# Patient Record
Sex: Female | Born: 1950 | Race: Black or African American | Hispanic: No | Marital: Single | Smoking: Former smoker
Health system: Southern US, Community
[De-identification: ages and names within clinical notes are randomized; demographics above are authoritative.]

## PROBLEM LIST (undated history)

## (undated) DIAGNOSIS — M51369 Other intervertebral disc degeneration, lumbar region without mention of lumbar back pain or lower extremity pain: Secondary | ICD-10-CM

## (undated) DIAGNOSIS — K219 Gastro-esophageal reflux disease without esophagitis: Secondary | ICD-10-CM

## (undated) DIAGNOSIS — I10 Essential (primary) hypertension: Secondary | ICD-10-CM

## (undated) DIAGNOSIS — G4730 Sleep apnea, unspecified: Secondary | ICD-10-CM

## (undated) DIAGNOSIS — G629 Polyneuropathy, unspecified: Secondary | ICD-10-CM

## (undated) DIAGNOSIS — M5136 Other intervertebral disc degeneration, lumbar region: Secondary | ICD-10-CM

## (undated) DIAGNOSIS — E0590 Thyrotoxicosis, unspecified without thyrotoxic crisis or storm: Secondary | ICD-10-CM

## (undated) DIAGNOSIS — J189 Pneumonia, unspecified organism: Secondary | ICD-10-CM

## (undated) DIAGNOSIS — R519 Headache, unspecified: Secondary | ICD-10-CM

## (undated) DIAGNOSIS — E119 Type 2 diabetes mellitus without complications: Secondary | ICD-10-CM

## (undated) DIAGNOSIS — R011 Cardiac murmur, unspecified: Secondary | ICD-10-CM

## (undated) DIAGNOSIS — R51 Headache: Secondary | ICD-10-CM

## (undated) HISTORY — PX: UPPER GI ENDOSCOPY: SHX6162

## (undated) HISTORY — PX: COLONOSCOPY W/ POLYPECTOMY: SHX1380

---

## 2012-07-17 DIAGNOSIS — M5417 Radiculopathy, lumbosacral region: Secondary | ICD-10-CM | POA: Insufficient documentation

## 2012-07-17 DIAGNOSIS — G894 Chronic pain syndrome: Secondary | ICD-10-CM | POA: Insufficient documentation

## 2012-07-17 DIAGNOSIS — Z79891 Long term (current) use of opiate analgesic: Secondary | ICD-10-CM | POA: Insufficient documentation

## 2015-06-24 DIAGNOSIS — I700 Atherosclerosis of aorta: Secondary | ICD-10-CM | POA: Insufficient documentation

## 2015-06-27 DIAGNOSIS — J189 Pneumonia, unspecified organism: Secondary | ICD-10-CM

## 2015-06-27 HISTORY — DX: Pneumonia, unspecified organism: J18.9

## 2015-10-19 DIAGNOSIS — E088 Diabetes mellitus due to underlying condition with unspecified complications: Secondary | ICD-10-CM | POA: Insufficient documentation

## 2015-10-19 DIAGNOSIS — I10 Essential (primary) hypertension: Secondary | ICD-10-CM | POA: Insufficient documentation

## 2016-05-16 DIAGNOSIS — N2889 Other specified disorders of kidney and ureter: Secondary | ICD-10-CM | POA: Insufficient documentation

## 2016-05-23 DIAGNOSIS — I351 Nonrheumatic aortic (valve) insufficiency: Secondary | ICD-10-CM | POA: Insufficient documentation

## 2016-05-26 HISTORY — PX: PARTIAL NEPHRECTOMY: SHX414

## 2016-06-13 DIAGNOSIS — R630 Anorexia: Secondary | ICD-10-CM

## 2016-06-13 DIAGNOSIS — N179 Acute kidney failure, unspecified: Secondary | ICD-10-CM

## 2016-06-13 DIAGNOSIS — I959 Hypotension, unspecified: Secondary | ICD-10-CM

## 2016-06-13 DIAGNOSIS — E860 Dehydration: Secondary | ICD-10-CM

## 2016-06-13 DIAGNOSIS — R110 Nausea: Secondary | ICD-10-CM

## 2016-06-13 DIAGNOSIS — I2510 Atherosclerotic heart disease of native coronary artery without angina pectoris: Secondary | ICD-10-CM

## 2016-06-13 DIAGNOSIS — N390 Urinary tract infection, site not specified: Secondary | ICD-10-CM

## 2016-06-13 DIAGNOSIS — E1169 Type 2 diabetes mellitus with other specified complication: Secondary | ICD-10-CM

## 2016-06-13 DIAGNOSIS — K219 Gastro-esophageal reflux disease without esophagitis: Secondary | ICD-10-CM

## 2016-06-13 DIAGNOSIS — I10 Essential (primary) hypertension: Secondary | ICD-10-CM

## 2016-06-13 DIAGNOSIS — E785 Hyperlipidemia, unspecified: Secondary | ICD-10-CM

## 2016-06-14 DIAGNOSIS — E860 Dehydration: Secondary | ICD-10-CM | POA: Diagnosis not present

## 2016-06-14 DIAGNOSIS — N179 Acute kidney failure, unspecified: Secondary | ICD-10-CM | POA: Diagnosis not present

## 2016-06-14 DIAGNOSIS — I959 Hypotension, unspecified: Secondary | ICD-10-CM | POA: Diagnosis not present

## 2016-06-14 DIAGNOSIS — R110 Nausea: Secondary | ICD-10-CM | POA: Diagnosis not present

## 2016-06-15 DIAGNOSIS — I959 Hypotension, unspecified: Secondary | ICD-10-CM | POA: Diagnosis not present

## 2016-06-15 DIAGNOSIS — N179 Acute kidney failure, unspecified: Secondary | ICD-10-CM | POA: Diagnosis not present

## 2016-06-15 DIAGNOSIS — E860 Dehydration: Secondary | ICD-10-CM | POA: Diagnosis not present

## 2016-06-15 DIAGNOSIS — R110 Nausea: Secondary | ICD-10-CM | POA: Diagnosis not present

## 2016-08-29 ENCOUNTER — Other Ambulatory Visit: Payer: Self-pay | Admitting: Neurosurgery

## 2016-09-22 NOTE — Pre-Procedure Instructions (Addendum)
    Laura Fox  09/22/2016      Report to [facility] Admitting  Wednesday, April 11 at 900 AM.               Your surgery or procedure is scheduled for 11:00 AM   Call this number if you have problems the morning of surgery:[phone]               For any other questions, please call [phone], Monday - Friday 8 AM - 4 PM.  Remember:  Do not eat food or drink liquids after midnight.  Take these medicines the morning of surgery with A SIP OF WATER gabapentin (neurontin), omeprazole (prilosec), oxycodone-acetaminophen (tylenol).  Stop taking any Vitamins or Herbal Medications. No Aspirin Goody's, BC's, Aleve, Advil, Motrin, Ibuprofen, naproxen or Fish oil.  Stop 1 week prior to surgery.   Do not wear jewelry, make-up or nail polish.  Do not wear lotions, powders, or perfumes, or deoderant.  Do not shave 48 hours prior to surgery.    Do not bring valuables to the hospital.  [facility] is not responsible for any belongings or valuables.  Contacts, dentures or bridgework may not be worn into surgery.  Leave your suitcase in the car.  After surgery it may be brought to your room.  For patients admitted to the hospital, discharge time will be determined by your treatment team.  Patients discharged the day of surgery will not be allowed to drive home.   Special instructions: Review  [place] - Preparing For Surgery.  Please read over the following fact sheets that you were given. Pain Booklet, Coughing and Deep Breathing, MRSA Information and Surgical Site Infection Prevention

## 2016-09-25 ENCOUNTER — Encounter (HOSPITAL_COMMUNITY)
Admission: RE | Admit: 2016-09-25 | Discharge: 2016-09-25 | Disposition: A | Discharge status: Home / Self Care | Payer: Medicare HMO | Source: Ambulatory Visit | Attending: Neurosurgery | Admitting: Neurosurgery

## 2016-09-25 ENCOUNTER — Encounter (HOSPITAL_COMMUNITY): Payer: Self-pay

## 2016-09-25 DIAGNOSIS — K219 Gastro-esophageal reflux disease without esophagitis: Secondary | ICD-10-CM | POA: Insufficient documentation

## 2016-09-25 DIAGNOSIS — E039 Hypothyroidism, unspecified: Secondary | ICD-10-CM | POA: Diagnosis not present

## 2016-09-25 DIAGNOSIS — I10 Essential (primary) hypertension: Secondary | ICD-10-CM | POA: Diagnosis not present

## 2016-09-25 DIAGNOSIS — I350 Nonrheumatic aortic (valve) stenosis: Secondary | ICD-10-CM | POA: Diagnosis not present

## 2016-09-25 DIAGNOSIS — E119 Type 2 diabetes mellitus without complications: Secondary | ICD-10-CM | POA: Insufficient documentation

## 2016-09-25 DIAGNOSIS — Z87891 Personal history of nicotine dependence: Secondary | ICD-10-CM | POA: Diagnosis not present

## 2016-09-25 DIAGNOSIS — Z7982 Long term (current) use of aspirin: Secondary | ICD-10-CM | POA: Diagnosis not present

## 2016-09-25 DIAGNOSIS — Z01818 Encounter for other preprocedural examination: Secondary | ICD-10-CM | POA: Insufficient documentation

## 2016-09-25 HISTORY — DX: Polyneuropathy, unspecified: G62.9

## 2016-09-25 HISTORY — DX: Gastro-esophageal reflux disease without esophagitis: K21.9

## 2016-09-25 HISTORY — DX: Headache, unspecified: R51.9

## 2016-09-25 HISTORY — DX: Pneumonia, unspecified organism: J18.9

## 2016-09-25 HISTORY — DX: Cardiac murmur, unspecified: R01.1

## 2016-09-25 HISTORY — DX: Essential (primary) hypertension: I10

## 2016-09-25 HISTORY — DX: Type 2 diabetes mellitus without complications: E11.9

## 2016-09-25 HISTORY — DX: Thyrotoxicosis, unspecified without thyrotoxic crisis or storm: E05.90

## 2016-09-25 HISTORY — DX: Other intervertebral disc degeneration, lumbar region: M51.36

## 2016-09-25 HISTORY — DX: Other intervertebral disc degeneration, lumbar region without mention of lumbar back pain or lower extremity pain: M51.369

## 2016-09-25 HISTORY — DX: Headache: R51

## 2016-09-25 LAB — CBC
HEMATOCRIT: 38 % (ref 36.0–46.0)
HEMOGLOBIN: 12.6 g/dL (ref 12.0–15.0)
MCH: 30.7 pg (ref 26.0–34.0)
MCHC: 33.2 g/dL (ref 30.0–36.0)
MCV: 92.5 fL (ref 78.0–100.0)
Platelets: 225 10*3/uL (ref 150–400)
RBC: 4.11 MIL/uL (ref 3.87–5.11)
RDW: 12.6 % (ref 11.5–15.5)
WBC: 8 10*3/uL (ref 4.0–10.5)

## 2016-09-25 LAB — BASIC METABOLIC PANEL
ANION GAP: 7 (ref 5–15)
BUN: 13 mg/dL (ref 6–20)
CO2: 27 mmol/L (ref 22–32)
Calcium: 9.1 mg/dL (ref 8.9–10.3)
Chloride: 106 mmol/L (ref 101–111)
Creatinine, Ser: 1.11 mg/dL — ABNORMAL HIGH (ref 0.44–1.00)
GFR calc Af Amer: 58 mL/min — ABNORMAL LOW (ref >60)
GFR, EST NON AFRICAN AMERICAN: 50 mL/min — AB (ref >60)
GLUCOSE: 100 mg/dL — AB (ref 65–99)
POTASSIUM: 3.9 mmol/L (ref 3.5–5.1)
Sodium: 140 mmol/L (ref 135–145)

## 2016-09-25 LAB — GLUCOSE, CAPILLARY: GLUCOSE-CAPILLARY: 97 mg/dL (ref 65–99)

## 2016-09-25 LAB — SURGICAL PCR SCREEN
MRSA, PCR: NEGATIVE
STAPHYLOCOCCUS AUREUS: NEGATIVE

## 2016-09-25 NOTE — Progress Notes (Signed)
Laura Fox denies chest pain or shortness of breath.  Patient was seen by cardiologist in Nov 2017.  Patient reports that that she was not  have chest pain then.

## 2016-09-26 LAB — HEMOGLOBIN A1C
Hgb A1c MFr Bld: 5.8 % — ABNORMAL HIGH (ref 4.8–5.6)
Mean Plasma Glucose: 120 mg/dL

## 2016-09-27 NOTE — Progress Notes (Addendum)
Anesthesia Chart Review:  Pt is a 66 year old female scheduled for left L4-5 microdiscectomy on 10/04/2016 with Tressie Stalker, M.D.  - Cardiologist is Belva Crome, MD, last office visit 05/23/16 (notes in care everywhere)  PMH includes: HTN, DM, hyperthyroidism, aortic stenosis (mild by 10/2015 echo), GERD. Former smoker. BMI 27.  Medications include: ASA 81 mg, Lipitor, lisinopril, Prilosec  Preoperative labs reviewed.  HbA1c 5.8, glucose 100  EKG 05/16/16: Sinus rhythm. Inferior infarct, age undetermined. Cannot rule out anterior infarct, age undetermined. Appears stable when compared to EKG 09/30/15  Nuclear stress test 11/11/15 [facility] cardiology [place]): 1. No ischemia seen on scan. 2. Normal gated images. 3. Normal EF 71%.  Echo 11/11/15 [facility] cardiology [place]): 1. Normal LV size and systolic function, EF 67%. 2. Calcific aortic stenosis, mild with moderate aortic regurgitation. 3. Mitral annular calcification with mild mitral regurgitation, mild LA dilatation. 4. Normal right heart size/function, only trace TR  If no changes, I anticipate pt can proceed with surgery as scheduled.   Rica Mast, FNP-BC [facility] Stay [facility]/Anesthesiology Phone: [phone] 09/27/2016 4:47 PM

## 2016-10-03 MED ORDER — CEFAZOLIN SODIUM-DEXTROSE 2-4 GM/100ML-% IV SOLN
2.0000 g | INTRAVENOUS | Status: AC
  Administered 2016-10-04: 2 g via INTRAVENOUS
  Filled 2016-10-03: qty 100

## 2016-10-04 ENCOUNTER — Ambulatory Visit (HOSPITAL_COMMUNITY): Payer: Medicare HMO | Admitting: Anesthesiology

## 2016-10-04 ENCOUNTER — Encounter (HOSPITAL_COMMUNITY)
Admission: RE | Disposition: A | Discharge status: Home / Self Care | Payer: Self-pay | Source: Ambulatory Visit | Attending: Neurosurgery

## 2016-10-04 ENCOUNTER — Observation Stay (HOSPITAL_COMMUNITY)
Admission: RE | Admit: 2016-10-04 | Discharge: 2016-10-05 | Disposition: A | Discharge status: Home / Self Care | Payer: Medicare HMO | Source: Ambulatory Visit | Attending: Neurosurgery | Admitting: Neurosurgery

## 2016-10-04 ENCOUNTER — Ambulatory Visit (HOSPITAL_COMMUNITY): Payer: Medicare HMO

## 2016-10-04 ENCOUNTER — Encounter (HOSPITAL_COMMUNITY): Payer: Self-pay | Admitting: *Deleted

## 2016-10-04 ENCOUNTER — Ambulatory Visit (HOSPITAL_COMMUNITY): Payer: Medicare HMO | Admitting: Emergency Medicine

## 2016-10-04 DIAGNOSIS — M5116 Intervertebral disc disorders with radiculopathy, lumbar region: Principal | ICD-10-CM | POA: Insufficient documentation

## 2016-10-04 DIAGNOSIS — M5126 Other intervertebral disc displacement, lumbar region: Secondary | ICD-10-CM | POA: Diagnosis present

## 2016-10-04 DIAGNOSIS — G8929 Other chronic pain: Secondary | ICD-10-CM | POA: Diagnosis present

## 2016-10-04 DIAGNOSIS — Z905 Acquired absence of kidney: Secondary | ICD-10-CM | POA: Diagnosis not present

## 2016-10-04 DIAGNOSIS — E1140 Type 2 diabetes mellitus with diabetic neuropathy, unspecified: Secondary | ICD-10-CM | POA: Diagnosis not present

## 2016-10-04 DIAGNOSIS — Z87891 Personal history of nicotine dependence: Secondary | ICD-10-CM | POA: Insufficient documentation

## 2016-10-04 DIAGNOSIS — Z79899 Other long term (current) drug therapy: Secondary | ICD-10-CM | POA: Insufficient documentation

## 2016-10-04 DIAGNOSIS — Z419 Encounter for procedure for purposes other than remedying health state, unspecified: Secondary | ICD-10-CM

## 2016-10-04 DIAGNOSIS — I10 Essential (primary) hypertension: Secondary | ICD-10-CM | POA: Diagnosis not present

## 2016-10-04 DIAGNOSIS — K219 Gastro-esophageal reflux disease without esophagitis: Secondary | ICD-10-CM | POA: Insufficient documentation

## 2016-10-04 DIAGNOSIS — Z7982 Long term (current) use of aspirin: Secondary | ICD-10-CM | POA: Diagnosis not present

## 2016-10-04 HISTORY — PX: LUMBAR LAMINECTOMY/DECOMPRESSION MICRODISCECTOMY: SHX5026

## 2016-10-04 LAB — GLUCOSE, CAPILLARY
GLUCOSE-CAPILLARY: 132 mg/dL — AB (ref 65–99)
GLUCOSE-CAPILLARY: 214 mg/dL — AB (ref 65–99)
GLUCOSE-CAPILLARY: 122 mg/dL — AB (ref 65–99)

## 2016-10-04 SURGERY — LUMBAR LAMINECTOMY/DECOMPRESSION MICRODISCECTOMY 1 LEVEL
Anesthesia: General | Laterality: Left

## 2016-10-04 MED ORDER — HYDRALAZINE HCL 20 MG/ML IJ SOLN
INTRAMUSCULAR | Status: AC
  Filled 2016-10-04: qty 1

## 2016-10-04 MED ORDER — BACITRACIN 50000 UNITS IM SOLR
INTRAMUSCULAR | Status: DC | PRN
  Administered 2016-10-04: 11:00:00

## 2016-10-04 MED ORDER — THROMBIN 5000 UNITS EX SOLR
CUTANEOUS | Status: AC
  Filled 2016-10-04: qty 10000

## 2016-10-04 MED ORDER — LIDOCAINE HCL (CARDIAC) 20 MG/ML IV SOLN
INTRAVENOUS | Status: DC | PRN
  Administered 2016-10-04: 80 mg via INTRAVENOUS

## 2016-10-04 MED ORDER — BISACODYL 10 MG RE SUPP
10.0000 mg | Freq: Every day | RECTAL | Status: DC | PRN
Start: 2016-10-04 — End: 2016-10-05

## 2016-10-04 MED ORDER — POLYETHYLENE GLYCOL 3350 17 G PO PACK: 17.0000 g | PACK | Freq: Every day | ORAL | Status: DC

## 2016-10-04 MED ORDER — ACETAMINOPHEN 325 MG PO TABS: 650.0000 mg | ORAL_TABLET | ORAL | Status: DC | PRN

## 2016-10-04 MED ORDER — OXYCODONE HCL 5 MG PO TABS
5.0000 mg | ORAL_TABLET | ORAL | Status: DC | PRN
  Administered 2016-10-04 – 2016-10-05 (×4): 10 mg via ORAL
  Filled 2016-10-04 (×4): qty 2

## 2016-10-04 MED ORDER — ONDANSETRON HCL 4 MG PO TABS: 4.0000 mg | ORAL_TABLET | Freq: Four times a day (QID) | ORAL | Status: DC | PRN

## 2016-10-04 MED ORDER — OXYCODONE HCL 5 MG/5ML PO SOLN: 5.0000 mg | Freq: Once | ORAL | Status: AC | PRN

## 2016-10-04 MED ORDER — PROPOFOL 10 MG/ML IV BOLUS
INTRAVENOUS | Status: DC | PRN
  Administered 2016-10-04: 160 mg via INTRAVENOUS

## 2016-10-04 MED ORDER — LIDOCAINE 2% (20 MG/ML) 5 ML SYRINGE
INTRAMUSCULAR | Status: AC
  Filled 2016-10-04: qty 10

## 2016-10-04 MED ORDER — THROMBIN 5000 UNITS EX SOLR
CUTANEOUS | Status: DC | PRN
  Administered 2016-10-04: 10000 [IU] via TOPICAL

## 2016-10-04 MED ORDER — NEOSTIGMINE METHYLSULFATE 10 MG/10ML IV SOLN
INTRAVENOUS | Status: DC | PRN
  Administered 2016-10-04: 3 mg via INTRAVENOUS

## 2016-10-04 MED ORDER — LISINOPRIL 20 MG PO TABS: 10.0000 mg | ORAL_TABLET | Freq: Every day | ORAL | Status: DC

## 2016-10-04 MED ORDER — OXYCODONE HCL 5 MG PO TABS
5.0000 mg | ORAL_TABLET | Freq: Once | ORAL | Status: AC | PRN
  Administered 2016-10-04: 5 mg via ORAL

## 2016-10-04 MED ORDER — BACITRACIN ZINC 500 UNIT/GM EX OINT
TOPICAL_OINTMENT | CUTANEOUS | Status: DC | PRN
  Administered 2016-10-04: 1 via TOPICAL

## 2016-10-04 MED ORDER — PANTOPRAZOLE SODIUM 40 MG PO TBEC
80.0000 mg | DELAYED_RELEASE_TABLET | Freq: Every day | ORAL | Status: DC
  Administered 2016-10-04 – 2016-10-05 (×2): 80 mg via ORAL
  Filled 2016-10-04 (×2): qty 2

## 2016-10-04 MED ORDER — ONDANSETRON HCL 4 MG/2ML IJ SOLN
INTRAMUSCULAR | Status: DC | PRN
  Administered 2016-10-04: 4 mg via INTRAVENOUS

## 2016-10-04 MED ORDER — DEXAMETHASONE SODIUM PHOSPHATE 10 MG/ML IJ SOLN
INTRAMUSCULAR | Status: AC
  Filled 2016-10-04: qty 2

## 2016-10-04 MED ORDER — CEFAZOLIN SODIUM-DEXTROSE 2-4 GM/100ML-% IV SOLN
2.0000 g | Freq: Three times a day (TID) | INTRAVENOUS | Status: AC
  Administered 2016-10-04 (×2): 2 g via INTRAVENOUS
  Filled 2016-10-04 (×2): qty 100

## 2016-10-04 MED ORDER — BACITRACIN ZINC 500 UNIT/GM EX OINT
TOPICAL_OINTMENT | CUTANEOUS | Status: AC
  Filled 2016-10-04: qty 28.35

## 2016-10-04 MED ORDER — FENTANYL CITRATE (PF) 100 MCG/2ML IJ SOLN
INTRAMUSCULAR | Status: DC | PRN
  Administered 2016-10-04: 100 ug via INTRAVENOUS
  Administered 2016-10-04: 50 ug via INTRAVENOUS

## 2016-10-04 MED ORDER — ACETAMINOPHEN 650 MG RE SUPP: 650.0000 mg | RECTAL | Status: DC | PRN

## 2016-10-04 MED ORDER — DOCUSATE SODIUM 100 MG PO CAPS
100.0000 mg | ORAL_CAPSULE | Freq: Two times a day (BID) | ORAL | Status: DC
  Administered 2016-10-04 – 2016-10-05 (×2): 100 mg via ORAL
  Filled 2016-10-04 (×2): qty 1

## 2016-10-04 MED ORDER — CHLORHEXIDINE GLUCONATE CLOTH 2 % EX PADS: 6.0000 | MEDICATED_PAD | Freq: Once | CUTANEOUS | Status: DC

## 2016-10-04 MED ORDER — GLYCOPYRROLATE 0.2 MG/ML IJ SOLN
INTRAMUSCULAR | Status: DC | PRN
  Administered 2016-10-04: 0.4 mg via INTRAVENOUS

## 2016-10-04 MED ORDER — LACTATED RINGERS IV SOLN
INTRAVENOUS | Status: DC
  Administered 2016-10-04 (×3): via INTRAVENOUS

## 2016-10-04 MED ORDER — MEPERIDINE HCL 25 MG/ML IJ SOLN: 6.2500 mg | INTRAMUSCULAR | Status: DC | PRN

## 2016-10-04 MED ORDER — SUGAMMADEX SODIUM 200 MG/2ML IV SOLN
INTRAVENOUS | Status: AC
  Filled 2016-10-04: qty 2

## 2016-10-04 MED ORDER — EPHEDRINE 5 MG/ML INJ
INTRAVENOUS | Status: AC
  Filled 2016-10-04: qty 10

## 2016-10-04 MED ORDER — SODIUM CHLORIDE 0.9% FLUSH
3.0000 mL | Freq: Two times a day (BID) | INTRAVENOUS | Status: DC
  Administered 2016-10-04 (×2): 3 mL via INTRAVENOUS

## 2016-10-04 MED ORDER — ROCURONIUM BROMIDE 50 MG/5ML IV SOSY
PREFILLED_SYRINGE | INTRAVENOUS | Status: AC
  Filled 2016-10-04: qty 5

## 2016-10-04 MED ORDER — PROMETHAZINE HCL 25 MG/ML IJ SOLN: 6.2500 mg | INTRAMUSCULAR | Status: DC | PRN

## 2016-10-04 MED ORDER — MORPHINE SULFATE (PF) 4 MG/ML IV SOLN: 4.0000 mg | INTRAVENOUS | Status: DC | PRN

## 2016-10-04 MED ORDER — ZOLPIDEM TARTRATE 5 MG PO TABS: 5.0000 mg | ORAL_TABLET | Freq: Every evening | ORAL | Status: DC | PRN

## 2016-10-04 MED ORDER — CYCLOBENZAPRINE HCL 10 MG PO TABS
10.0000 mg | ORAL_TABLET | Freq: Three times a day (TID) | ORAL | Status: DC | PRN
  Administered 2016-10-04 – 2016-10-05 (×3): 10 mg via ORAL
  Filled 2016-10-04 (×2): qty 1

## 2016-10-04 MED ORDER — NEOSTIGMINE METHYLSULFATE 5 MG/5ML IV SOSY
PREFILLED_SYRINGE | INTRAVENOUS | Status: AC
  Filled 2016-10-04: qty 5

## 2016-10-04 MED ORDER — EPHEDRINE SULFATE 50 MG/ML IJ SOLN
INTRAMUSCULAR | Status: DC | PRN
  Administered 2016-10-04 (×4): 10 mg via INTRAVENOUS

## 2016-10-04 MED ORDER — MIDAZOLAM HCL 5 MG/5ML IJ SOLN
INTRAMUSCULAR | Status: DC | PRN
  Administered 2016-10-04: 2 mg via INTRAVENOUS

## 2016-10-04 MED ORDER — HYDROMORPHONE HCL 1 MG/ML IJ SOLN: 0.2500 mg | INTRAMUSCULAR | Status: DC | PRN

## 2016-10-04 MED ORDER — SODIUM CHLORIDE 0.9% FLUSH: 3.0000 mL | INTRAVENOUS | Status: DC | PRN

## 2016-10-04 MED ORDER — PHENOL 1.4 % MT LIQD: 1.0000 | OROMUCOSAL | Status: DC | PRN

## 2016-10-04 MED ORDER — MENTHOL 3 MG MT LOZG: 1.0000 | LOZENGE | OROMUCOSAL | Status: DC | PRN

## 2016-10-04 MED ORDER — LIDOCAINE-EPINEPHRINE (PF) 2 %-1:200000 IJ SOLN
INTRAMUSCULAR | Status: AC
  Filled 2016-10-04: qty 20

## 2016-10-04 MED ORDER — OXYCODONE HCL 5 MG PO TABS
ORAL_TABLET | ORAL | Status: AC
  Administered 2016-10-04: 5 mg via ORAL
  Filled 2016-10-04: qty 1

## 2016-10-04 MED ORDER — TRAZODONE HCL 150 MG PO TABS
150.0000 mg | ORAL_TABLET | Freq: Every day | ORAL | Status: DC
  Administered 2016-10-04: 150 mg via ORAL
  Filled 2016-10-04 (×2): qty 1

## 2016-10-04 MED ORDER — SODIUM CHLORIDE 0.9 % IV SOLN: 250.0000 mL | INTRAVENOUS | Status: DC

## 2016-10-04 MED ORDER — GABAPENTIN 600 MG PO TABS
600.0000 mg | ORAL_TABLET | Freq: Three times a day (TID) | ORAL | Status: DC
  Administered 2016-10-04 – 2016-10-05 (×3): 600 mg via ORAL
  Filled 2016-10-04 (×3): qty 1

## 2016-10-04 MED ORDER — ONDANSETRON HCL 4 MG/2ML IJ SOLN: 4.0000 mg | Freq: Four times a day (QID) | INTRAMUSCULAR | Status: DC | PRN

## 2016-10-04 MED ORDER — ONDANSETRON HCL 4 MG/2ML IJ SOLN
INTRAMUSCULAR | Status: AC
  Filled 2016-10-04: qty 6

## 2016-10-04 MED ORDER — LIDOCAINE-EPINEPHRINE (PF) 2 %-1:200000 IJ SOLN
INTRAMUSCULAR | Status: DC | PRN
  Administered 2016-10-04 (×2): 10 mL via INTRADERMAL

## 2016-10-04 MED ORDER — CYCLOBENZAPRINE HCL 10 MG PO TABS
ORAL_TABLET | ORAL | Status: AC
  Administered 2016-10-04: 10 mg via ORAL
  Filled 2016-10-04: qty 1

## 2016-10-04 MED ORDER — ATORVASTATIN CALCIUM 20 MG PO TABS
20.0000 mg | ORAL_TABLET | Freq: Every day | ORAL | Status: DC
  Administered 2016-10-04: 20 mg via ORAL
  Filled 2016-10-04: qty 1

## 2016-10-04 MED ORDER — DEXAMETHASONE SODIUM PHOSPHATE 10 MG/ML IJ SOLN
INTRAMUSCULAR | Status: DC | PRN
  Administered 2016-10-04: 10 mg via INTRAVENOUS

## 2016-10-04 MED ORDER — ROCURONIUM BROMIDE 100 MG/10ML IV SOLN
INTRAVENOUS | Status: DC | PRN
  Administered 2016-10-04: 50 mg via INTRAVENOUS

## 2016-10-04 MED ORDER — 0.9 % SODIUM CHLORIDE (POUR BTL) OPTIME
TOPICAL | Status: DC | PRN
  Administered 2016-10-04: 1000 mL

## 2016-10-04 MED ORDER — POLYVINYL ALCOHOL 1.4 % OP SOLN
1.0000 [drp] | Freq: Every day | OPHTHALMIC | Status: DC
  Administered 2016-10-04: 1 [drp] via OPHTHALMIC
  Filled 2016-10-04: qty 15

## 2016-10-04 MED ORDER — HEMOSTATIC AGENTS (NO CHARGE) OPTIME
TOPICAL | Status: DC | PRN
  Administered 2016-10-04: 1 via TOPICAL

## 2016-10-04 MED ORDER — FENTANYL CITRATE (PF) 250 MCG/5ML IJ SOLN
INTRAMUSCULAR | Status: AC
  Filled 2016-10-04: qty 5

## 2016-10-04 SURGICAL SUPPLY — 48 items
BAG DECANTER FOR FLEXI CONT (MISCELLANEOUS) ×3 IMPLANT
BENZOIN TINCTURE PRP APPL 2/3 (GAUZE/BANDAGES/DRESSINGS) ×3 IMPLANT
BLADE CLIPPER SURG (BLADE) IMPLANT
BUR MATCHSTICK NEURO 3.0 LAGG (BURR) ×3 IMPLANT
BUR PRECISION FLUTE 6.0 (BURR) ×3 IMPLANT
CANISTER SUCT 3000ML PPV (MISCELLANEOUS) ×3 IMPLANT
CARTRIDGE OIL MAESTRO DRILL (MISCELLANEOUS) ×1 IMPLANT
CLOSURE WOUND 1/2 X4 (GAUZE/BANDAGES/DRESSINGS) ×1
DIFFUSER DRILL AIR PNEUMATIC (MISCELLANEOUS) ×3 IMPLANT
DRAPE LAPAROTOMY 100X72X124 (DRAPES) ×3 IMPLANT
DRAPE MICROSCOPE LEICA (MISCELLANEOUS) ×3 IMPLANT
DRAPE POUCH INSTRU U-SHP 10X18 (DRAPES) ×3 IMPLANT
DRAPE SURG 17X23 STRL (DRAPES) ×12 IMPLANT
ELECT BLADE 4.0 EZ CLEAN MEGAD (MISCELLANEOUS) ×3
ELECT REM PT RETURN 9FT ADLT (ELECTROSURGICAL) ×3
ELECTRODE BLDE 4.0 EZ CLN MEGD (MISCELLANEOUS) ×1 IMPLANT
ELECTRODE REM PT RTRN 9FT ADLT (ELECTROSURGICAL) ×1 IMPLANT
GAUZE SPONGE 4X4 12PLY STRL (GAUZE/BANDAGES/DRESSINGS) ×3 IMPLANT
GAUZE SPONGE 4X4 16PLY XRAY LF (GAUZE/BANDAGES/DRESSINGS) IMPLANT
GLOVE BIO SURGEON STRL SZ8 (GLOVE) ×3 IMPLANT
GLOVE BIO SURGEON STRL SZ8.5 (GLOVE) ×3 IMPLANT
GLOVE EXAM NITRILE LRG STRL (GLOVE) IMPLANT
GLOVE EXAM NITRILE XL STR (GLOVE) IMPLANT
GLOVE EXAM NITRILE XS STR PU (GLOVE) IMPLANT
GOWN STRL REUS W/ TWL LRG LVL3 (GOWN DISPOSABLE) IMPLANT
GOWN STRL REUS W/ TWL XL LVL3 (GOWN DISPOSABLE) ×1 IMPLANT
GOWN STRL REUS W/TWL 2XL LVL3 (GOWN DISPOSABLE) IMPLANT
GOWN STRL REUS W/TWL LRG LVL3 (GOWN DISPOSABLE)
GOWN STRL REUS W/TWL XL LVL3 (GOWN DISPOSABLE) ×2
KIT BASIN OR (CUSTOM PROCEDURE TRAY) ×3 IMPLANT
KIT ROOM TURNOVER OR (KITS) ×3 IMPLANT
NEEDLE HYPO 21X1.5 SAFETY (NEEDLE) IMPLANT
NEEDLE HYPO 22GX1.5 SAFETY (NEEDLE) ×3 IMPLANT
NS IRRIG 1000ML POUR BTL (IV SOLUTION) ×3 IMPLANT
OIL CARTRIDGE MAESTRO DRILL (MISCELLANEOUS) ×3
PACK LAMINECTOMY NEURO (CUSTOM PROCEDURE TRAY) ×3 IMPLANT
PAD ARMBOARD 7.5X6 YLW CONV (MISCELLANEOUS) ×9 IMPLANT
PATTIES SURGICAL .5 X1 (DISPOSABLE) ×3 IMPLANT
RUBBERBAND STERILE (MISCELLANEOUS) ×6 IMPLANT
SPONGE SURGIFOAM ABS GEL SZ50 (HEMOSTASIS) ×6 IMPLANT
STRIP CLOSURE SKIN 1/2X4 (GAUZE/BANDAGES/DRESSINGS) ×2 IMPLANT
SUT VIC AB 1 CT1 18XBRD ANBCTR (SUTURE) ×1 IMPLANT
SUT VIC AB 1 CT1 8-18 (SUTURE) ×2
SUT VIC AB 2-0 CP2 18 (SUTURE) ×3 IMPLANT
TAPE CLOTH SURG 4X10 WHT LF (GAUZE/BANDAGES/DRESSINGS) ×3 IMPLANT
TOWEL GREEN STERILE (TOWEL DISPOSABLE) ×3 IMPLANT
TOWEL GREEN STERILE FF (TOWEL DISPOSABLE) ×2 IMPLANT
WATER STERILE IRR 1000ML POUR (IV SOLUTION) ×3 IMPLANT

## 2016-10-04 NOTE — Transfer of Care (Signed)
Immediate Anesthesia Transfer of Care Note  Patient: Laura Fox  Procedure(s) Performed: Procedure(s) with comments: MICRODISCECTOMY LEFT LUMBAR FOUR- LUMBAR FIVE (Left) - MICRODISCECTOMY LEFT LUMBAR 4- LUMBAR 5  Patient Location: PACU  Anesthesia Type:General  Level of Consciousness: awake, alert , oriented and patient cooperative  Airway & Oxygen Therapy: Patient Spontanous Breathing and Patient connected to nasal cannula oxygen  Post-op Assessment: Report given to RN and Post -op Vital signs reviewed and stable  Post vital signs: Reviewed and stable  Last Vitals:  Vitals:   10/04/16 0818  BP: 120/75  Pulse: 65  Resp: 18  Temp: 36.6 C    Last Pain:  Vitals:   10/04/16 0825  TempSrc:   PainSc: 7          Complications: No apparent anesthesia complications

## 2016-10-04 NOTE — Anesthesia Preprocedure Evaluation (Addendum)
Anesthesia Evaluation  Patient identified by MRN, date of birth, ID band Patient awake    Reviewed: Allergy & Precautions, NPO status , Patient's Chart, lab work & pertinent test results  Airway Mallampati: II  TM Distance: >3 FB Neck ROM: Full    Dental no notable dental hx. (+) Edentulous Upper, Upper Dentures   Pulmonary neg pulmonary ROS, former smoker,    Pulmonary exam normal breath sounds clear to auscultation       Cardiovascular hypertension, negative cardio ROS Normal cardiovascular exam+ Valvular Problems/Murmurs AS  Rhythm:Regular Rate:Normal     Neuro/Psych negative neurological ROS  negative psych ROS   GI/Hepatic negative GI ROS, Neg liver ROS, GERD  ,  Endo/Other  negative endocrine ROSdiabetes  Renal/GU negative Renal ROS  negative genitourinary   Musculoskeletal negative musculoskeletal ROS (+) Arthritis ,   Abdominal   Peds negative pediatric ROS (+)  Hematology negative hematology ROS (+)   Anesthesia Other Findings   Reproductive/Obstetrics negative OB ROS                            Anesthesia Physical Anesthesia Plan  ASA: II  Anesthesia Plan: General   Post-op Pain Management:    Induction: Intravenous  Airway Management Planned: Oral ETT  Additional Equipment:   Intra-op Plan:   Post-operative Plan: Extubation in OR  Informed Consent: I have reviewed the patients History and Physical, chart, labs and discussed the procedure including the risks, benefits and alternatives for the proposed anesthesia with the patient or authorized representative who has indicated his/her understanding and acceptance.   Dental advisory given  Plan Discussed with: CRNA  Anesthesia Plan Comments:         Anesthesia Quick Evaluation

## 2016-10-04 NOTE — Progress Notes (Signed)
Subjective:  The patient is somnolent but easily arousable. She is in no apparent distress. She looks well.  Objective: Vital signs in last 24 hours: Temp:  [97.8 F (36.6 C)-97.9 F (36.6 C)] 97.9 F (36.6 C) (04/11 1110) Pulse Rate:  [62-77] 62 (04/11 1145) Resp:  [12-18] 12 (04/11 1145) BP: (105-120)/(37-75) 105/39 (04/11 1145) SpO2:  [100 %] 100 % (04/11 1145) Weight:  [78.9 kg (174 lb)-79 kg (174 lb 4 oz)] 78.9 kg (174 lb) (04/11 0825)  Intake/Output from previous day: No intake/output data recorded. Intake/Output this shift: Total I/O In: 1500 [I.V.:1500] Out: 75 [Blood:75]  Physical exam the patient is somnolent but arousable. She is moving her lower extremities well.  Lab Results: No results for input(s): WBC, HGB, HCT, PLT in the last 72 hours. BMET No results for input(s): NA, K, CL, CO2, GLUCOSE, BUN, CREATININE, CALCIUM in the last 72 hours.  Studies/Results: Dg Lumbar Spine 1 View  Result Date: 10/04/2016 CLINICAL DATA:  Microdiscectomy left L4-5. EXAM: LUMBAR SPINE - 1 VIEW COMPARISON:  MRI of the lumbar spine 03/22/2017 FINDINGS: A surgical probe and sponge are at the level of the L4 spinous process. AP alignment is stable. Atherosclerotic changes are noted. IMPRESSION: Surgical probe and sponge at the L4 spinous process. Electronically Signed   By: Marin Roberts M.D.   On: 10/04/2016 10:27    Assessment/Plan: The patient is doing well. I spoke with her family.  LOS: 0 days     Jasminemarie Sherrard D 10/04/2016, 11:53 AM

## 2016-10-04 NOTE — Op Note (Signed)
Brief history: The patient is a 66 year old black female who has complained of diffuse pain. Recently she developed left lower extremity radicular symptoms. She has failed medical management and was worked up with a lumbar MRI which demonstrated a herniated disc at L4-5 on the left. I discussed the various treatment options with her. She has decided to proceed with surgery after weighing the risks, benefits, and alternatives.  Preoperative diagnosis: Left L4-5 herniated disc, lumbar radiculopathy, lumbago  Postoperative diagnosis: The same  Procedure: Left L4-5 Intervertebral discectomy using micro-dissection  Surgeon: Dr. Delma Officer  Asst.: None  Anesthesia: Gen. endotracheal  Estimated blood loss: Minimal  Drains: None  Complications: None  Description of procedure: The patient was brought to the operating room by the anesthesia team. General endotracheal anesthesia was induced. The patient was turned to the prone position on the Wilson frame. The patient's lumbosacral region was then prepared with Betadine scrub and Betadine solution. Sterile drapes were applied.  I then injected the area to be incised with Marcaine with epinephrine solution. I then used a scalpel to make a linear midline incision over the L4-5 intervertebral disc space. I then used electrocautery to perform a left sided subperiosteal dissection exposing the spinous process and lamina of L4 and L5. We obtained intraoperative radiograph to confirm our location. I then inserted the [facility] retractor for exposure.  We then brought the operative microscope into the field. Under its magnification and illumination we completed the microdissection. I used a high-speed drill to perform a laminotomy at L4-5 on the left. I then used a Kerrison punches to widen the laminotomy and removed the ligamentum flavum at L4-5 on the left. We then used microdissection to free up the thecal sac and the L5 nerve root from the epidural  tissue. I then used a Kerrison punch to perform a foraminotomy at about the left L5 nerve root. We then using the nerve root retractor to gently retract the thecal sac and the left L5 nerve root medially. This exposed the intervertebral disc herniation. We identified the ruptured disc and remove it with the pituitary forceps. I performed a partial intervertebral vasectomy with the pituitary forceps.  I then palpated along the ventral surface of the thecal sac and along exit route of the left L5 nerve root and noted that the neural structures were well decompressed. This completed the decompression.  We then obtained hemostasis using bipolar electrocautery. We irrigated the wound out with bacitracin solution. We then removed the retractor. We then reapproximated the patient's thoracolumbar fascia with interrupted #1 Vicryl suture. We then reapproximated the patient's subcutaneous tissue with interrupted 2-0 Vicryl suture. We then reapproximated patient's skin with Steri-Strips and benzoin. The was then coated with bacitracin ointment. The drapes were removed. The patient was subsequently returned to the supine position where they were extubated by the anesthesia team. The patient was then transported to the postanesthesia care unit in stable condition. All sponge instrument and needle counts were reportedly correct at the end of this case.

## 2016-10-04 NOTE — Evaluation (Signed)
Physical Therapy Evaluation Patient Details Name: Laura Fox MRN: [id number] DOB: 1950-07-30 Today's Date: 10/04/2016   History of Present Illness  Pt is 66 y/o female s/p L4-5 diskectomy. PMH includes DM, DDD, murmur, HTN, and neuropathy.   Clinical Impression  Patient is s/p above surgery resulting in the deficits listed below (see PT Problem List). PTA, pt was independent with all functional mobility. Upon evaluation, pt with decreased strength and balance. Pt requiring min A and HHA for mobility. Will need to attempt gait training with RW next session. Recommending HHPT at d/c to increase independence and safety with functional mobility.  Patient will benefit from skilled PT to increase their independence and safety with mobility (while adhering to their precautions) to allow discharge to the venue listed below. Will continue to follow.      Follow Up Recommendations Home health PT;Supervision/Assistance - 24 hour    Equipment Recommendations  3in1 (PT)    Recommendations for Other Services       Precautions / Restrictions Precautions Precautions: Back Precaution Booklet Issued: Yes (comment) Precaution Comments: Reviewed back precaution handout with pt. Restrictions Weight Bearing Restrictions: No      Mobility  Bed Mobility Overal bed mobility: Needs Assistance Bed Mobility: Rolling;Supine to Sit Rolling: Min guard   Supine to sit: Min guard     General bed mobility comments: Min guard for safety. Verbal cues for use of log roll technique.   Transfers Overall transfer level: Needs assistance Equipment used: 1 person hand held assist Transfers: Sit to/from Stand Sit to Stand: Min guard         General transfer comment: Min guard for steadying   Ambulation/Gait Ambulation/Gait assistance: Min assist Ambulation Distance (Feet): 100 Feet Assistive device: 1 person hand held assist Gait Pattern/deviations: Step-through pattern;Decreased stride  length Gait velocity: Decreased Gait velocity interpretation: Below normal speed for age/gender General Gait Details: Slow, guarded gait. Min A and HHA for steadying. Educated about use of RW for mobility, will attempt next session.   Stairs            Wheelchair Mobility    Modified Rankin (Stroke Patients Only)       Balance Overall balance assessment: Needs assistance Sitting-balance support: No upper extremity supported;Feet supported Sitting balance-Leahy Scale: Good     Standing balance support: Single extremity supported;During functional activity Standing balance-Leahy Scale: Poor Standing balance comment: Reliant on HHA and RW for steadying                              Pertinent Vitals/Pain Pain Assessment: Faces Faces Pain Scale: Hurts even more Pain Location: back  Pain Descriptors / Indicators: Operative site guarding;Sore Pain Intervention(s): Limited activity within patient's tolerance;Monitored during session;Repositioned    Home Living Family/patient expects to be discharged to:: Private residence Living Arrangements: Other (Comment) (grandson ) Available Help at Discharge: Family;Available PRN/intermittently Type of Home: House Home Access: Stairs to enter Entrance Stairs-Rails: Right;Left;Can reach both Entrance Stairs-Number of Steps: 3 Home Layout: One level Home Equipment: Walker - 2 wheels      Prior Function Level of Independence: Independent               Hand Dominance        Extremity/Trunk Assessment   Upper Extremity Assessment Upper Extremity Assessment: Overall WFL for tasks assessed    Lower Extremity Assessment Lower Extremity Assessment: Generalized weakness (At least 3+/5 throughout )    Cervical /  Trunk Assessment Cervical / Trunk Assessment: Other exceptions Cervical / Trunk Exceptions: s/p surgery   Communication   Communication: No difficulties  Cognition Arousal/Alertness:  Awake/alert Behavior During Therapy: WFL for tasks assessed/performed Overall Cognitive Status: Within Functional Limits for tasks assessed                                        General Comments General comments (skin integrity, edema, etc.): Pt's grandson present throughout session. Educated about generalized walking program to perform at home. Educated about use of RW at home.     Exercises     Assessment/Plan    PT Assessment Patient needs continued PT services  PT Problem List Decreased strength;Decreased balance;Decreased mobility;Decreased knowledge of use of DME;Decreased knowledge of precautions;Pain       PT Treatment Interventions DME instruction;Gait training;Stair training;Functional mobility training;Therapeutic activities;Therapeutic exercise;Balance training;Neuromuscular re-education;Patient/family education    PT Goals (Current goals can be found in the Care Plan section)  Acute Rehab PT Goals Patient Stated Goal: to return home  PT Goal Formulation: With patient Time For Goal Achievement: 10/11/16 Potential to Achieve Goals: Good    Frequency Min 5X/week   Barriers to discharge Decreased caregiver support Grandson available PRN     Co-evaluation               End of Session Equipment Utilized During Treatment: Gait belt Activity Tolerance: Patient tolerated treatment well Patient left: in bed;with call bell/phone within reach;with family/visitor present Nurse Communication: Mobility status PT Visit Diagnosis: Other abnormalities of gait and mobility (R26.89);Pain Pain - part of body:  (back )    Time: 4098-1191 PT Time Calculation (min) (ACUTE ONLY): 20 min   Charges:   PT Evaluation $PT Eval Low Complexity: 1 Procedure PT Treatments $Gait Training: 8-22 mins   PT G Codes:   PT G-Codes **NOT FOR INPATIENT CLASS** Functional Assessment Tool Used: AM-PAC 6 Clicks Basic Mobility;Clinical judgement Functional Limitation:  Mobility: Walking and moving around Mobility: Walking and Moving Around Current Status ([id number]): At least 40 percent but less than 60 percent impaired, limited or restricted Mobility: Walking and Moving Around Goal Status [phone]): At least 1 percent but less than 20 percent impaired, limited or restricted    Margot Chimes, PT, DPT  Acute Rehabilitation Services  Pager: [phone]   Melvyn Novas 10/04/2016, 4:36 PM

## 2016-10-04 NOTE — H&P (Signed)
Subjective: The patient is a 66 year old black female who has had chronic pain. More recently she has developed back and left buttock and leg pain consistent with a lumbar radiculopathy. She has failed medical management and was worked up with a lumbar MRI. This demonstrated a left herniated disc at L4-5. I discussed the various treatment options with the patient. She has decided to proceed with surgery.   Past Medical History:  Diagnosis Date  . DDD (degenerative disc disease), lumbar   . 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 GERD (gastroesophageal reflux disease)   . Headache   . Heart murmur    Echo-  04/2016- mild aortic stenosis  . Hypertension   . Hyperthyroidism    goiter  . Neuropathy (HCC)    feet  . Pneumonia 2017    Past Surgical History:  Procedure Laterality Date  . COLONOSCOPY W/ POLYPECTOMY    . PARTIAL NEPHRECTOMY Left 05/26/2016   stent  . UPPER GI ENDOSCOPY      Allergies  Allergen Reactions  . No Known Allergies     Social History  Substance Use Topics  . Smoking status: Former Smoker    Years: 55.00  . Smokeless tobacco: Never Used     Comment: Quick  2015  . Alcohol use No    History reviewed. No pertinent family history. Prior to Admission medications   Medication Sig Start Date End Date Taking? Authorizing Provider  aspirin EC 81 MG tablet Take 81 mg by mouth daily.   Yes Historical Provider, MD  atorvastatin (LIPITOR) 20 MG tablet Take 20 mg by mouth daily at 6 PM.   Yes Historical Provider, MD  B Complex-C (SUPER B COMPLEX/VITAMIN C PO) Take 1 tablet by mouth daily.   Yes Historical Provider, MD  betamethasone dipropionate (DIPROLENE) 0.05 % cream Apply topically 2 (two) times daily.   Yes Historical Provider, MD  gabapentin (NEURONTIN) 600 MG tablet Take 600 mg by mouth 3 (three) times daily.   Yes Historical Provider, MD  lisinopril (PRINIVIL,ZESTRIL) 20 MG tablet Take 10 mg by mouth daily.    Yes Historical Provider, MD  omeprazole (PRILOSEC) 40 MG capsule Take 40 mg by mouth daily.   Yes Historical Provider, MD  oxyCODONE-acetaminophen (PERCOCET) 7.5-325 MG tablet Take 1 tablet by mouth every 8 (eight) hours as needed for severe pain.   Yes Historical Provider, MD  Polyethyl Glycol-Propyl Glycol (SYSTANE ULTRA HOME-AWAY PACK OP) Apply 1 drop to eye daily.   Yes Historical Provider, MD  polyethylene glycol (MIRALAX / GLYCOLAX) packet Take 17 g by mouth daily as needed.   Yes Historical Provider, MD  traZODone (DESYREL) 50 MG tablet Take 150 mg by mouth at bedtime.   Yes Historical Provider, MD     Review of Systems  Positive ROS: As above  All other systems have been reviewed and were otherwise negative with the exception of those mentioned in the HPI and as above.  Objective: Vital signs in last 24 hours: Temp:  [97.8 F (36.6 C)] 97.8 F (36.6 C) (04/11 0818) Pulse Rate:  [65] 65 (04/11 0818) Resp:  [18] 18 (04/11 0818) BP: (120)/(75) 120/75 (04/11 0818) SpO2:  [100 %] 100 % (04/11 0818) Weight:  [78.9 kg (174 lb)-79 kg (174 lb 4 oz)] 78.9 kg (174 lb) (04/11 0825)  General Appearance: Alert Head: Normocephalic, without obvious abnormality, atraumatic Eyes: PERRL, conjunctiva/corneas clear, EOM's intact,    Ears: Normal  Throat: Normal  Neck: Supple, Back: unremarkable Lungs: Clear to auscultation bilaterally, respirations unlabored Heart: Regular rate and rhythm, no murmur, rub or gallop Abdomen: Soft, non-tender Extremities: Extremities normal, atraumatic, no cyanosis or edema Skin: unremarkable  NEUROLOGIC:   Mental status: alert and oriented,Motor Exam - grossly normal Sensory Exam - grossly normal Reflexes:  Coordination - grossly normal Gait - grossly normal Balance - grossly normal Cranial Nerves: I: smell Not tested  II: visual acuity  OS: Normal  OD: Normal   II: visual fields Full to confrontation  II: pupils Equal, round, reactive to light   III,VII: ptosis None  III,IV,VI: extraocular muscles  Full ROM  V: mastication Normal  V: facial light touch sensation  Normal  V,VII: corneal reflex  Present  VII: facial muscle function - upper  Normal  VII: facial muscle function - lower Normal  VIII: hearing Not tested  IX: soft palate elevation  Normal  IX,X: gag reflex Present  XI: trapezius strength  5/5  XI: sternocleidomastoid strength 5/5  XI: neck flexion strength  5/5  XII: tongue strength  Normal    Data Review Lab Results  Component Value Date   WBC 8.0 09/25/2016   HGB 12.6 09/25/2016   HCT 38.0 09/25/2016   MCV 92.5 09/25/2016   PLT 225 09/25/2016   Lab Results  Component Value Date   NA 140 09/25/2016   K 3.9 09/25/2016   CL 106 09/25/2016   CO2 27 09/25/2016   BUN 13 09/25/2016   CREATININE 1.11 (H) 09/25/2016   GLUCOSE 100 (H) 09/25/2016   No results found for: INR, PROTIME  Assessment/Plan: Left L4-5 herniated disc, lumbago, lumbar radiculopathy: I have discussed the situation with the patient and reviewed her imaging studies with her. We have discussed the various treatment options including surgery. I have described the surgical treatment option of a left L4-5 discectomy. I have shown her surgical models. We have discussed the risks, benefits, alternatives, expected postoperative course, and likelihood of achieving her goals with surgery. I have answered all her questions. She has decided to proceed with surgery.   Marielouise Amey D 10/04/2016 9:23 AM

## 2016-10-05 ENCOUNTER — Encounter (HOSPITAL_COMMUNITY): Payer: Self-pay | Admitting: Neurosurgery

## 2016-10-05 DIAGNOSIS — M5116 Intervertebral disc disorders with radiculopathy, lumbar region: Secondary | ICD-10-CM | POA: Diagnosis not present

## 2016-10-05 LAB — GLUCOSE, CAPILLARY: Glucose-Capillary: 78 mg/dL (ref 65–99)

## 2016-10-05 MED ORDER — OXYCODONE-ACETAMINOPHEN 10-325 MG PO TABS: 1.0000 | ORAL_TABLET | ORAL | 0 refills | Status: DC | PRN

## 2016-10-05 MED ORDER — CYCLOBENZAPRINE HCL 10 MG PO TABS: 10.0000 mg | ORAL_TABLET | Freq: Three times a day (TID) | ORAL | 1 refills | Status: DC | PRN

## 2016-10-05 MED ORDER — DOCUSATE SODIUM 100 MG PO CAPS: 100.0000 mg | ORAL_CAPSULE | Freq: Two times a day (BID) | ORAL | 0 refills | Status: DC

## 2016-10-05 NOTE — Progress Notes (Signed)
Patient alert and oriented, mae's well, voiding adequate amount of urine, swallowing without difficulty, no c/o pain at time of discharge. Patient discharged home with family. Script and discharged instructions given to patient. Patient and family stated understanding of instructions given. Patient has an appointment with Dr. Jenkins   

## 2016-10-05 NOTE — Discharge Summary (Signed)
Physician Discharge Summary  Patient ID: Laura Fox MRN: [id number] DOB/AGE: 68-Dec-1952 68 y.o.  Admit date: 10/04/2016 Discharge date: 10/05/2016  Admission Diagnoses:Left L4-5 herniated disc, lumbago, lumbar radiculopathy  Discharge Diagnoses: Same Active Problems:   Lumbar herniated disc   Discharged Condition: good  Hospital Course: I performed a left L4-5 discectomy on the patient on 10/05/2016. Surgery went well.  The patient's postoperative course was unremarkable. On postoperative day #1 the patient requested discharge to home. She was given oral and written discharge instructions. All her questions were answered.  Consults: Physical therapy Significant Diagnostic Studies: None Treatments: Left L4-5 discectomy using microdissection Discharge Exam: Blood pressure (!) 95/48, pulse 69, temperature 98.5 F (36.9 C), temperature source Oral, resp. rate 20, height  (1.702 m), weight 78.9 kg (174 lb), SpO2 97 %. The patient is alert and pleasant. She looks well. Her strength is grossly normal in her lower extremities.  Disposition: Home  Discharge Instructions    Call MD for:  difficulty breathing, headache or visual disturbances    Complete by:  As directed    Call MD for:  extreme fatigue    Complete by:  As directed    Call MD for:  hives    Complete by:  As directed    Call MD for:  persistant dizziness or light-headedness    Complete by:  As directed    Call MD for:  persistant nausea and vomiting    Complete by:  As directed    Call MD for:  redness, tenderness, or signs of infection (pain, swelling, redness, odor or green/yellow discharge around incision site)    Complete by:  As directed    Call MD for:  severe uncontrolled pain    Complete by:  As directed    Call MD for:  temperature >100.4    Complete by:  As directed    Diet - low sodium heart healthy    Complete by:  As directed    Discharge instructions    Complete by:  As directed    Call [phone] for a followup appointment. Take a stool softener while you are using pain medications.   Driving Restrictions    Complete by:  As directed    Do not drive for 2 weeks.   Increase activity slowly    Complete by:  As directed    Lifting restrictions    Complete by:  As directed    Do not lift more than 5 pounds. No excessive bending or twisting.   May shower / Bathe    Complete by:  As directed    He may shower after the pain she is removed 3 days after surgery. Leave the incision alone.   Remove dressing in 48 hours    Complete by:  As directed    Your stitches are under the scan and will dissolve by themselves. The Steri-Strips will fall off after you take a few showers. Do not rub back or pick at the wound, Leave the wound alone.     Allergies as of 10/05/2016      Reactions   No Known Allergies       Medication List    STOP taking these medications   oxyCODONE-acetaminophen 7.5-325 MG tablet Commonly known as:  PERCOCET Replaced by:  oxyCODONE-acetaminophen 10-325 MG tablet     TAKE these medications   aspirin EC 81 MG tablet Take 81 mg by mouth daily.   atorvastatin 20 MG tablet Commonly known  as:  LIPITOR Take 20 mg by mouth daily at 6 PM.   betamethasone dipropionate 0.05 % cream Commonly known as:  DIPROLENE Apply topically 2 (two) times daily.   cyclobenzaprine 10 MG tablet Commonly known as:  FLEXERIL Take 1 tablet (10 mg total) by mouth 3 (three) times daily as needed for muscle spasms.   docusate sodium 100 MG capsule Commonly known as:  COLACE Take 1 capsule (100 mg total) by mouth 2 (two) times daily.   gabapentin 600 MG tablet Commonly known as:  NEURONTIN Take 600 mg by mouth 3 (three) times daily.   lisinopril 20 MG tablet Commonly known as:  PRINIVIL,ZESTRIL Take 10 mg by mouth daily.   omeprazole 40 MG capsule Commonly known as:  PRILOSEC Take 40 mg by mouth daily.   oxyCODONE-acetaminophen 10-325 MG tablet Commonly  known as:  PERCOCET Take 1 tablet by mouth every 4 (four) hours as needed for pain. Replaces:  oxyCODONE-acetaminophen 7.5-325 MG tablet   polyethylene glycol packet Commonly known as:  MIRALAX / GLYCOLAX Take 17 g by mouth daily as needed.   SUPER B COMPLEX/VITAMIN C PO Take 1 tablet by mouth daily.   SYSTANE ULTRA HOME-AWAY PACK OP Apply 1 drop to eye daily.   traZODone 50 MG tablet Commonly known as:  DESYREL Take 150 mg by mouth at bedtime.            Durable Medical Equipment        Start     Ordered   10/04/16 1623  For home use only DME 3 n 1  Once     10/04/16 1625       Signed: Phinneas Shakoor D 10/05/2016, 7:36 AM

## 2016-10-05 NOTE — Anesthesia Postprocedure Evaluation (Signed)
Anesthesia Post Note  Patient: Laura Fox  Procedure(s) Performed: Procedure(s) (LRB): MICRODISCECTOMY LEFT LUMBAR FOUR- LUMBAR FIVE (Left)  Patient location during evaluation: PACU Anesthesia Type: General Level of consciousness: awake and alert Pain management: pain level controlled Vital Signs Assessment: post-procedure vital signs reviewed and stable Respiratory status: spontaneous breathing, nonlabored ventilation and respiratory function stable Cardiovascular status: blood pressure returned to baseline and stable Postop Assessment: no signs of nausea or vomiting Anesthetic complications: no        Last Vitals:  Vitals:   10/05/16 0400 10/05/16 0813  BP: (!) 95/48 109/60  Pulse: 69 79  Resp: 20 16  Temp: 36.9 C 36.9 C    Last Pain:  Vitals:   10/05/16 0938  TempSrc:   PainSc: 4    Pain Goal: Patients Stated Pain Goal: 3 (10/05/16 0359)               [name]

## 2016-10-05 NOTE — Progress Notes (Signed)
Physical Therapy Treatment Patient Details Name: Laura Fox MRN: [id number] DOB: Nov 09, 1950 Today's Date: 10/05/2016    History of Present Illness Pt is 66 y/o female s/p L4-5 diskectomy. PMH includes DM, DDD, murmur, HTN, and neuropathy.     PT Comments    Pt progressing towards physical therapy goals. Was able to perform transfers and ambulation with min guard to supervision for safety. Pt completed stair training and was educated on precautions, walking program, car transfer, and general safety with mobility at home. Will continue to follow and progress as able per POC.    Follow Up Recommendations  Home health PT;Supervision/Assistance - 24 hour     Equipment Recommendations  3in1 (PT)    Recommendations for Other Services       Precautions / Restrictions Precautions Precautions: Back Precaution Booklet Issued: Yes (comment) Precaution Comments: Reviewed back precaution handout with pt. Restrictions Weight Bearing Restrictions: No    Mobility  Bed Mobility               General bed mobility comments: Pt sitting up on EOB when PT arrived.   Transfers Overall transfer level: Needs assistance Equipment used: None Transfers: Sit to/from Stand Sit to Stand: Min guard         General transfer comment: Min guard for steadying assist.  Ambulation/Gait Ambulation/Gait assistance: Min guard;Supervision Ambulation Distance (Feet): 350 Feet Assistive device: None Gait Pattern/deviations: Step-through pattern;Decreased stride length Gait velocity: Decreased Gait velocity interpretation: Below normal speed for age/gender General Gait Details: Slow but steady gait. No assist required, and pt progressed to supervision by end of gait training.    Stairs Stairs: Yes   Stair Management: One rail Right;Alternating pattern;Forwards Number of Stairs: 10 General stair comments: VC's for sequencing (step-to) but pt was comfortable with alternating step pattern  and chose to complete the flight of stairs.   Wheelchair Mobility    Modified Rankin (Stroke Patients Only)       Balance Overall balance assessment: Needs assistance Sitting-balance support: No upper extremity supported;Feet supported Sitting balance-Leahy Scale: Good     Standing balance support: Single extremity supported;During functional activity Standing balance-Leahy Scale: Fair                              Cognition Arousal/Alertness: Awake/alert Behavior During Therapy: WFL for tasks assessed/performed Overall Cognitive Status: Within Functional Limits for tasks assessed                                        Exercises      General Comments        Pertinent Vitals/Pain Pain Assessment: Faces Faces Pain Scale: Hurts even more Pain Location: back  Pain Descriptors / Indicators: Operative site guarding;Sore Pain Intervention(s): Limited activity within patient's tolerance;Monitored during session;Repositioned    Home Living Family/patient expects to be discharged to:: Private residence Living Arrangements: Other (Comment) (grandson ) Available Help at Discharge: Family;Available PRN/intermittently Type of Home: House Home Access: Stairs to enter Entrance Stairs-Rails: Right;Left;Can reach both Home Layout: One level Home Equipment: Environmental consultant - 2 wheels      Prior Function Level of Independence: Independent          PT Goals (current goals can now be found in the care plan section) Acute Rehab PT Goals Patient Stated Goal: to return home  PT Goal Formulation: With  patient Time For Goal Achievement: 10/11/16 Potential to Achieve Goals: Good Progress towards PT goals: Progressing toward goals    Frequency    Min 5X/week      PT Plan Current plan remains appropriate    Co-evaluation             End of Session Equipment Utilized During Treatment: Gait belt Activity Tolerance: Patient tolerated treatment  well Patient left: in bed;with call bell/phone within reach;with family/visitor present Nurse Communication: Mobility status PT Visit Diagnosis: Other abnormalities of gait and mobility (R26.89);Pain Pain - part of body:  (back )     Time: 1610-9604 PT Time Calculation (min) (ACUTE ONLY): 20 min  Charges:  $Gait Training: 8-22 mins                    G Codes:       Conni Slipper, PT, DPT Acute Rehabilitation Services Pager: [phone]    Marylynn Pearson 10/05/2016, 11:34 AM

## 2016-10-05 NOTE — Care Management Note (Signed)
Case Management Note  Patient Details  Name: Chemika Nightengale MRN: [id number] Date of Birth: 09-Apr-1951  Subjective/Objective:  66 yr old female admitted with L4-5 herniated disc, patient underwent L4-5 discectomy.                  Action/Plan: Case manager spoke with patient concerning Home Health and DME needs at discharge. Choice for Home Health agency was offered. Referral was called to Delle Reining, [facility] Liaison. Patient states she will have family support at discharge.    Expected Discharge Date:  10/05/16               Expected Discharge Plan:  Home w Home Health Services  In-House Referral:  NA  Discharge planning Services  CM Consult  Post Acute Care Choice:  Durable Medical Equipment, Home Health Choice offered to:  Patient  DME Arranged:  3-N-1 DME Agency:  [facility]  HH Arranged:  PT [facility] Agency:  [facility]  Status of Service:  Completed, signed off  If discussed at Long Length of Stay Meetings, dates discussed:    Additional Comments:  Durenda Guthrie, RN 10/05/2016, 11:03 AM

## 2017-05-09 ENCOUNTER — Other Ambulatory Visit: Payer: Self-pay | Admitting: Neurosurgery

## 2017-05-24 ENCOUNTER — Inpatient Hospital Stay (HOSPITAL_COMMUNITY)
Admission: RE | Admit: 2017-05-24 | Discharge: 2017-05-24 | Disposition: A | Discharge status: Home / Self Care | Payer: Medicare HMO | Source: Ambulatory Visit

## 2017-05-28 ENCOUNTER — Inpatient Hospital Stay: Admit: 2017-05-28 | Payer: Medicare HMO | Admitting: Neurosurgery

## 2017-05-28 SURGERY — POSTERIOR LUMBAR FUSION 1 LEVEL
Anesthesia: General

## 2017-07-12 DIAGNOSIS — M6702 Short Achilles tendon (acquired), left ankle: Secondary | ICD-10-CM | POA: Insufficient documentation

## 2017-07-12 DIAGNOSIS — M7741 Metatarsalgia, right foot: Secondary | ICD-10-CM | POA: Insufficient documentation

## 2017-07-12 DIAGNOSIS — M2040 Other hammer toe(s) (acquired), unspecified foot: Secondary | ICD-10-CM | POA: Insufficient documentation

## 2017-07-12 DIAGNOSIS — M6701 Short Achilles tendon (acquired), right ankle: Secondary | ICD-10-CM | POA: Insufficient documentation

## 2017-08-21 DIAGNOSIS — N183 Chronic kidney disease, stage 3 unspecified: Secondary | ICD-10-CM | POA: Insufficient documentation

## 2017-09-03 ENCOUNTER — Other Ambulatory Visit: Payer: Self-pay | Admitting: Neurosurgery

## 2017-09-21 NOTE — Pre-Procedure Instructions (Signed)
[facility]  09/21/2017      [facility] - [address], [place] - [id number] [place] [id number] Phone: [phone] Fax: [phone]    Your procedure is scheduled on Monday April 8.  [name] 11:15 A.M.  Call this number if you have problems the morning of surgery:  [phone]   Remember:  Do not eat food or drink liquids after midnight.  Take these medicines the morning of surgery with A SIP OF WATER:   Gabapentin (neurontin) Omeprazole (prilosec) Loratadine (claritin) if needed Oxycodone-acetaminophen (Percocet) if needed  7 days prior to surgery STOP taking any Aspirin(unless otherwise instructed by your surgeon), Aleve, Naproxen, Ibuprofen, Motrin, Advil, Goody's, BC's, all herbal medications, fish oil, and all vitamins    Do not wear jewelry, make-up or nail polish.  Do not wear lotions, powders, or perfumes, or deodorant.  Do not shave 48 hours prior to surgery.  Men may shave face and neck.  Do not bring valuables to the hospital.  [facility] is not responsible for any belongings or valuables.  Contacts, dentures or bridgework may not be worn into surgery.  Leave your suitcase in the car.  After surgery it may be brought to your room.  For patients admitted to the hospital, discharge time will be determined by your treatment team.  Patients discharged the day of surgery will not be allowed to drive home.   Special instructions:    [name]  Before surgery, you can play an important role. Because skin is not sterile, your skin needs to be as free of germs as possible. You can reduce the number of germs on your skin by washing with CHG (chlorahexidine gluconate) Soap before surgery.  CHG is an antiseptic cleaner which kills germs and bonds with the skin to continue killing germs even after washing.  Please do not use if you have an allergy to CHG or  antibacterial soaps. If your skin becomes reddened/irritated stop using the CHG.  Do not shave (including legs and underarms) for at least 48 hours prior to first CHG shower. It is OK to shave your face.  Please follow these instructions carefully.   1. Shower the NIGHT BEFORE SURGERY and the MORNING OF SURGERY with CHG.   2. If you chose to wash your hair, wash your hair first as usual with your normal shampoo.  3. After you shampoo, rinse your hair and body thoroughly to remove the shampoo.  4. Use CHG as you would any other liquid soap. You can apply CHG directly to the skin and wash gently with a scrungie or a clean washcloth.   5. Apply the CHG Soap to your body ONLY FROM THE NECK DOWN.  Do not use on open wounds or open sores. Avoid contact with your eyes, ears, mouth and genitals (private parts). Wash Face and genitals (private parts)  with your normal soap.  6. Wash thoroughly, paying special attention to the area where your surgery will be performed.  7. Thoroughly rinse your body with warm water from the neck down.  8. DO NOT shower/wash with your normal soap after using and rinsing off the CHG Soap.  9. Pat yourself dry with a CLEAN TOWEL.  10. Wear CLEAN PAJAMAS to bed the night before surgery, wear comfortable clothes the morning of surgery  11. Place CLEAN SHEETS on your bed the night of your first shower  and DO NOT SLEEP WITH PETS.    Day of Surgery: Do not apply any deodorants/lotions. Please wear clean clothes to the hospital/surgery center.      Please read over the following fact sheets that you were given. Coughing and Deep Breathing, MRSA Information and Surgical Site Infection Prevention

## 2017-09-24 ENCOUNTER — Encounter (HOSPITAL_COMMUNITY)
Admission: RE | Admit: 2017-09-24 | Discharge: 2017-09-24 | Disposition: A | Discharge status: Home / Self Care | Payer: Medicare HMO | Source: Ambulatory Visit | Attending: Neurosurgery | Admitting: Neurosurgery

## 2017-09-24 ENCOUNTER — Other Ambulatory Visit: Payer: Self-pay

## 2017-09-24 ENCOUNTER — Encounter (HOSPITAL_COMMUNITY): Payer: Self-pay

## 2017-09-24 DIAGNOSIS — Z01812 Encounter for preprocedural laboratory examination: Secondary | ICD-10-CM | POA: Insufficient documentation

## 2017-09-24 DIAGNOSIS — E119 Type 2 diabetes mellitus without complications: Secondary | ICD-10-CM | POA: Insufficient documentation

## 2017-09-24 DIAGNOSIS — Z01810 Encounter for preprocedural cardiovascular examination: Secondary | ICD-10-CM | POA: Insufficient documentation

## 2017-09-24 HISTORY — DX: Sleep apnea, unspecified: G47.30

## 2017-09-24 LAB — BASIC METABOLIC PANEL
ANION GAP: 9 (ref 5–15)
BUN: 10 mg/dL (ref 6–20)
CO2: 27 mmol/L (ref 22–32)
Calcium: 9.2 mg/dL (ref 8.9–10.3)
Chloride: 104 mmol/L (ref 101–111)
Creatinine, Ser: 1.15 mg/dL — ABNORMAL HIGH (ref 0.44–1.00)
GFR calc Af Amer: 55 mL/min — ABNORMAL LOW (ref >60)
GFR, EST NON AFRICAN AMERICAN: 48 mL/min — AB (ref >60)
GLUCOSE: 136 mg/dL — AB (ref 65–99)
POTASSIUM: 4.1 mmol/L (ref 3.5–5.1)
Sodium: 140 mmol/L (ref 135–145)

## 2017-09-24 LAB — CBC
HCT: 39.8 % (ref 36.0–46.0)
Hemoglobin: 12.9 g/dL (ref 12.0–15.0)
MCH: 31.1 pg (ref 26.0–34.0)
MCHC: 32.4 g/dL (ref 30.0–36.0)
MCV: 95.9 fL (ref 78.0–100.0)
PLATELETS: 226 10*3/uL (ref 150–400)
RBC: 4.15 MIL/uL (ref 3.87–5.11)
RDW: 12.5 % (ref 11.5–15.5)
WBC: 7.4 10*3/uL (ref 4.0–10.5)

## 2017-09-24 LAB — TYPE AND SCREEN
ABO/RH(D): A POS
ANTIBODY SCREEN: NEGATIVE

## 2017-09-24 LAB — ABO/RH: ABO/RH(D): A POS

## 2017-09-24 LAB — HEMOGLOBIN A1C
Hgb A1c MFr Bld: 5.6 % (ref 4.8–5.6)
Mean Plasma Glucose: 114.02 mg/dL

## 2017-09-24 LAB — SURGICAL PCR SCREEN
MRSA, PCR: NEGATIVE
STAPHYLOCOCCUS AUREUS: NEGATIVE

## 2017-09-24 LAB — GLUCOSE, CAPILLARY: Glucose-Capillary: 172 mg/dL — ABNORMAL HIGH (ref 65–99)

## 2017-09-24 NOTE — Progress Notes (Signed)
REQUESTED STRESS TEST, EKG, ECHO, [facility].[phone]

## 2017-09-25 NOTE — Progress Notes (Signed)
Anesthesia Chart Review:  Pt is a 67 year old female scheduled for L4-5 PLIF on 10/01/2017 with Newman Pies, MD  - PCP is Edyth Gunnels, NP (notes in care everywhere)  - Cardiologist is Jyl Heinz, MD, last office visit 12/14/16 with Towana Badger, MD (notes in care everywhere)  PMH includes: HTN, DM, hyperthyroidism, aortic stenosis (mild by 01/2017 echo), GERD. Former smoker. BMI 30. S/p lumbar microdiscectomy 10/04/16  Medications include: ASA 81 mg, Lipitor, lisinopril, Prilosec, zantac  BP 129/84   Pulse 80   Temp 36.6 C   Resp 20   Ht _0  (1.676 m)   Wt 188 lb 1.6 oz (85.3 kg)   SpO2 98%   BMI 30.36 kg/m    Preoperative labs reviewed.  HbA1c 5.6, glucose 136  EKG 09/24/17: NSR. Inferior infarct, age undetermined. Appears stable when compared to EKG 05/16/16 (in correspondence 10/06/16 in media tab)  Echo 02/06/17 [facility] cardiology [place]): 1.  Normal LV size and systolic function.  EF 60-65%.  Mild LVH. 2.  Mildly dilated LA. 3.  Mild aortic stenosis with moderate aortic regurgitation. 4.  Moderate MAC. 5.  Trace mitral and tricuspid regurgitation.  Nuclear stress test 11/11/15 (correspondence 10/06/16 in media tab): 1. No ischemia seen on scan. 2. Normal gated images. 3. Normal EF 71%.  If no changes, I anticipate pt can proceed with surgery as scheduled.   Willeen Cass, FNP-BC [facility]/Anesthesiology Phone: [phone] 09/25/2017 12:03 PM

## 2017-10-01 ENCOUNTER — Other Ambulatory Visit: Payer: Self-pay

## 2017-10-01 ENCOUNTER — Inpatient Hospital Stay (HOSPITAL_COMMUNITY): Payer: Medicare HMO

## 2017-10-01 ENCOUNTER — Encounter (HOSPITAL_COMMUNITY)
Admission: RE | Disposition: A | Discharge status: Home Health | Payer: Self-pay | Source: Ambulatory Visit | Attending: Neurosurgery

## 2017-10-01 ENCOUNTER — Inpatient Hospital Stay (HOSPITAL_COMMUNITY): Payer: Medicare HMO | Admitting: Emergency Medicine

## 2017-10-01 ENCOUNTER — Inpatient Hospital Stay (HOSPITAL_COMMUNITY): Payer: Medicare HMO | Admitting: Anesthesiology

## 2017-10-01 ENCOUNTER — Inpatient Hospital Stay (HOSPITAL_COMMUNITY)
Admission: RE | Admit: 2017-10-01 | Discharge: 2017-10-03 | DRG: 455 | Disposition: A | Discharge status: Home Health | Payer: Medicare HMO | Source: Ambulatory Visit | Attending: Neurosurgery | Admitting: Neurosurgery

## 2017-10-01 ENCOUNTER — Encounter (HOSPITAL_COMMUNITY): Payer: Self-pay | Admitting: Surgery

## 2017-10-01 DIAGNOSIS — K219 Gastro-esophageal reflux disease without esophagitis: Secondary | ICD-10-CM | POA: Diagnosis not present

## 2017-10-01 DIAGNOSIS — Z419 Encounter for procedure for purposes other than remedying health state, unspecified: Secondary | ICD-10-CM

## 2017-10-01 DIAGNOSIS — I350 Nonrheumatic aortic (valve) stenosis: Secondary | ICD-10-CM | POA: Diagnosis not present

## 2017-10-01 DIAGNOSIS — M48061 Spinal stenosis, lumbar region without neurogenic claudication: Secondary | ICD-10-CM | POA: Diagnosis present

## 2017-10-01 DIAGNOSIS — Z7982 Long term (current) use of aspirin: Secondary | ICD-10-CM | POA: Diagnosis not present

## 2017-10-01 DIAGNOSIS — I10 Essential (primary) hypertension: Secondary | ICD-10-CM | POA: Diagnosis present

## 2017-10-01 DIAGNOSIS — E119 Type 2 diabetes mellitus without complications: Secondary | ICD-10-CM | POA: Diagnosis present

## 2017-10-01 DIAGNOSIS — M5116 Intervertebral disc disorders with radiculopathy, lumbar region: Principal | ICD-10-CM | POA: Diagnosis present

## 2017-10-01 DIAGNOSIS — E0590 Thyrotoxicosis, unspecified without thyrotoxic crisis or storm: Secondary | ICD-10-CM | POA: Diagnosis present

## 2017-10-01 DIAGNOSIS — M4316 Spondylolisthesis, lumbar region: Secondary | ICD-10-CM | POA: Diagnosis present

## 2017-10-01 DIAGNOSIS — G4730 Sleep apnea, unspecified: Secondary | ICD-10-CM | POA: Diagnosis not present

## 2017-10-01 LAB — GLUCOSE, CAPILLARY
GLUCOSE-CAPILLARY: 105 mg/dL — AB (ref 65–99)
GLUCOSE-CAPILLARY: 145 mg/dL — AB (ref 65–99)
GLUCOSE-CAPILLARY: 171 mg/dL — AB (ref 65–99)

## 2017-10-01 SURGERY — POSTERIOR LUMBAR FUSION 1 LEVEL
Anesthesia: General | Site: Back

## 2017-10-01 MED ORDER — ROCURONIUM BROMIDE 100 MG/10ML IV SOLN
INTRAVENOUS | Status: DC | PRN
  Administered 2017-10-01 (×2): 20 mg via INTRAVENOUS
  Administered 2017-10-01: 50 mg via INTRAVENOUS

## 2017-10-01 MED ORDER — DOCUSATE SODIUM 100 MG PO CAPS
100.0000 mg | ORAL_CAPSULE | Freq: Two times a day (BID) | ORAL | Status: DC
  Administered 2017-10-01 – 2017-10-03 (×4): 100 mg via ORAL
  Filled 2017-10-01 (×4): qty 1

## 2017-10-01 MED ORDER — BUPIVACAINE LIPOSOME 1.3 % IJ SUSP
INTRAMUSCULAR | Status: DC | PRN
  Administered 2017-10-01: 20 mL

## 2017-10-01 MED ORDER — ACETAMINOPHEN 650 MG RE SUPP: 650.0000 mg | RECTAL | Status: DC | PRN

## 2017-10-01 MED ORDER — BUPIVACAINE-EPINEPHRINE (PF) 0.5% -1:200000 IJ SOLN
INTRAMUSCULAR | Status: DC | PRN
  Administered 2017-10-01: 10 mL

## 2017-10-01 MED ORDER — SODIUM CHLORIDE 0.9% FLUSH: 3.0000 mL | INTRAVENOUS | Status: DC | PRN

## 2017-10-01 MED ORDER — ACETAMINOPHEN 325 MG PO TABS
650.0000 mg | ORAL_TABLET | ORAL | Status: DC | PRN
  Administered 2017-10-03: 650 mg via ORAL
  Filled 2017-10-01: qty 2

## 2017-10-01 MED ORDER — OXYCODONE HCL 5 MG PO TABS
ORAL_TABLET | ORAL | Status: AC
  Filled 2017-10-01: qty 1

## 2017-10-01 MED ORDER — DEXAMETHASONE SODIUM PHOSPHATE 10 MG/ML IJ SOLN
INTRAMUSCULAR | Status: DC | PRN
  Administered 2017-10-01: 5 mg via INTRAVENOUS

## 2017-10-01 MED ORDER — LACTATED RINGERS IV SOLN
INTRAVENOUS | Status: DC
  Administered 2017-10-01 (×2): via INTRAVENOUS

## 2017-10-01 MED ORDER — EPHEDRINE SULFATE 50 MG/ML IJ SOLN
INTRAMUSCULAR | Status: DC | PRN
  Administered 2017-10-01: 10 mg via INTRAVENOUS
  Administered 2017-10-01: 5 mg via INTRAVENOUS
  Administered 2017-10-01 (×2): 10 mg via INTRAVENOUS
  Administered 2017-10-01: 5 mg via INTRAVENOUS
  Administered 2017-10-01: 10 mg via INTRAVENOUS

## 2017-10-01 MED ORDER — OXYCODONE HCL 5 MG PO TABS
10.0000 mg | ORAL_TABLET | ORAL | Status: DC | PRN
  Administered 2017-10-01 – 2017-10-03 (×8): 10 mg via ORAL
  Filled 2017-10-01 (×9): qty 2

## 2017-10-01 MED ORDER — PHENOL 1.4 % MT LIQD: 1.0000 | OROMUCOSAL | Status: DC | PRN

## 2017-10-01 MED ORDER — LIDOCAINE 2% (20 MG/ML) 5 ML SYRINGE
INTRAMUSCULAR | Status: AC
  Filled 2017-10-01: qty 5

## 2017-10-01 MED ORDER — ONDANSETRON HCL 4 MG PO TABS: 4.0000 mg | ORAL_TABLET | Freq: Four times a day (QID) | ORAL | Status: DC | PRN

## 2017-10-01 MED ORDER — CEFAZOLIN SODIUM-DEXTROSE 2-4 GM/100ML-% IV SOLN
2.0000 g | INTRAVENOUS | Status: AC
  Administered 2017-10-01: 2 g via INTRAVENOUS

## 2017-10-01 MED ORDER — ESMOLOL HCL 100 MG/10ML IV SOLN
INTRAVENOUS | Status: DC | PRN
  Administered 2017-10-01: 40 mg via INTRAVENOUS

## 2017-10-01 MED ORDER — FENTANYL CITRATE (PF) 100 MCG/2ML IJ SOLN
INTRAMUSCULAR | Status: AC
  Filled 2017-10-01: qty 2

## 2017-10-01 MED ORDER — TRAZODONE HCL 150 MG PO TABS
150.0000 mg | ORAL_TABLET | Freq: Every day | ORAL | Status: DC
  Administered 2017-10-01 – 2017-10-02 (×2): 150 mg via ORAL
  Filled 2017-10-01 (×2): qty 1

## 2017-10-01 MED ORDER — PROPOFOL 10 MG/ML IV BOLUS
INTRAVENOUS | Status: AC
  Filled 2017-10-01: qty 20

## 2017-10-01 MED ORDER — ACETAMINOPHEN 160 MG/5ML PO SOLN: 325.0000 mg | ORAL | Status: DC | PRN

## 2017-10-01 MED ORDER — MORPHINE SULFATE (PF) 4 MG/ML IV SOLN
4.0000 mg | INTRAVENOUS | Status: DC | PRN
  Administered 2017-10-01: 4 mg via INTRAVENOUS
  Filled 2017-10-01: qty 1

## 2017-10-01 MED ORDER — BACITRACIN ZINC 500 UNIT/GM EX OINT
TOPICAL_OINTMENT | CUTANEOUS | Status: DC | PRN
  Administered 2017-10-01: 1 via TOPICAL

## 2017-10-01 MED ORDER — DEXAMETHASONE SODIUM PHOSPHATE 10 MG/ML IJ SOLN
INTRAMUSCULAR | Status: AC
  Filled 2017-10-01: qty 1

## 2017-10-01 MED ORDER — ALBUMIN HUMAN 5 % IV SOLN
INTRAVENOUS | Status: DC | PRN
  Administered 2017-10-01: 15:00:00 via INTRAVENOUS

## 2017-10-01 MED ORDER — BACITRACIN ZINC 500 UNIT/GM EX OINT
TOPICAL_OINTMENT | CUTANEOUS | Status: AC
  Filled 2017-10-01: qty 28.35

## 2017-10-01 MED ORDER — BUPIVACAINE LIPOSOME 1.3 % IJ SUSP
20.0000 mL | Freq: Once | INTRAMUSCULAR | Status: DC
  Filled 2017-10-01: qty 20

## 2017-10-01 MED ORDER — CHLORHEXIDINE GLUCONATE CLOTH 2 % EX PADS: 6.0000 | MEDICATED_PAD | Freq: Once | CUTANEOUS | Status: DC

## 2017-10-01 MED ORDER — EPHEDRINE 5 MG/ML INJ
INTRAVENOUS | Status: AC
  Filled 2017-10-01: qty 10

## 2017-10-01 MED ORDER — THROMBIN (RECOMBINANT) 5000 UNITS EX SOLR
CUTANEOUS | Status: DC | PRN
  Administered 2017-10-01: 15:00:00 via TOPICAL

## 2017-10-01 MED ORDER — BISACODYL 10 MG RE SUPP: 10.0000 mg | Freq: Every day | RECTAL | Status: DC | PRN

## 2017-10-01 MED ORDER — FENTANYL CITRATE (PF) 100 MCG/2ML IJ SOLN
25.0000 ug | INTRAMUSCULAR | Status: DC | PRN
  Administered 2017-10-01: 50 ug via INTRAVENOUS

## 2017-10-01 MED ORDER — SUGAMMADEX SODIUM 200 MG/2ML IV SOLN
INTRAVENOUS | Status: AC
  Filled 2017-10-01: qty 2

## 2017-10-01 MED ORDER — OXYCODONE HCL 5 MG PO TABS
5.0000 mg | ORAL_TABLET | Freq: Once | ORAL | Status: AC | PRN
  Administered 2017-10-01: 5 mg via ORAL

## 2017-10-01 MED ORDER — PANTOPRAZOLE SODIUM 40 MG PO TBEC
80.0000 mg | DELAYED_RELEASE_TABLET | Freq: Every day | ORAL | Status: DC
  Administered 2017-10-02 – 2017-10-03 (×2): 80 mg via ORAL
  Filled 2017-10-01 (×2): qty 2

## 2017-10-01 MED ORDER — GABAPENTIN 600 MG PO TABS
1200.0000 mg | ORAL_TABLET | Freq: Every day | ORAL | Status: DC
  Administered 2017-10-01 – 2017-10-02 (×2): 1200 mg via ORAL
  Filled 2017-10-01 (×2): qty 2

## 2017-10-01 MED ORDER — CYCLOBENZAPRINE HCL 10 MG PO TABS
10.0000 mg | ORAL_TABLET | Freq: Three times a day (TID) | ORAL | Status: DC | PRN
  Administered 2017-10-01: 10 mg via ORAL
  Filled 2017-10-01: qty 1

## 2017-10-01 MED ORDER — ONDANSETRON HCL 4 MG/2ML IJ SOLN
INTRAMUSCULAR | Status: AC
  Filled 2017-10-01: qty 2

## 2017-10-01 MED ORDER — ESMOLOL HCL 100 MG/10ML IV SOLN
INTRAVENOUS | Status: AC
  Filled 2017-10-01: qty 10

## 2017-10-01 MED ORDER — OXYCODONE HCL 5 MG PO TABS: 5.0000 mg | ORAL_TABLET | ORAL | Status: DC | PRN

## 2017-10-01 MED ORDER — THROMBIN 5000 UNITS EX SOLR
CUTANEOUS | Status: AC
  Filled 2017-10-01: qty 5000

## 2017-10-01 MED ORDER — CEFAZOLIN SODIUM-DEXTROSE 2-4 GM/100ML-% IV SOLN
2.0000 g | Freq: Three times a day (TID) | INTRAVENOUS | Status: AC
  Administered 2017-10-01 – 2017-10-02 (×2): 2 g via INTRAVENOUS
  Filled 2017-10-01 (×2): qty 100

## 2017-10-01 MED ORDER — POLYVINYL ALCOHOL 1.4 % OP SOLN
1.0000 [drp] | Freq: Every day | OPHTHALMIC | Status: DC
  Administered 2017-10-01 – 2017-10-03 (×3): 1 [drp] via OPHTHALMIC
  Filled 2017-10-01: qty 15

## 2017-10-01 MED ORDER — OXYCODONE HCL 5 MG/5ML PO SOLN: 5.0000 mg | Freq: Once | ORAL | Status: AC | PRN

## 2017-10-01 MED ORDER — CEFAZOLIN SODIUM-DEXTROSE 2-4 GM/100ML-% IV SOLN
INTRAVENOUS | Status: AC
  Filled 2017-10-01: qty 100

## 2017-10-01 MED ORDER — POLYETHYLENE GLYCOL 3350 17 G PO PACK: 17.0000 g | PACK | Freq: Every day | ORAL | Status: DC | PRN

## 2017-10-01 MED ORDER — MENTHOL 3 MG MT LOZG: 1.0000 | LOZENGE | OROMUCOSAL | Status: DC | PRN

## 2017-10-01 MED ORDER — PHENYLEPHRINE 40 MCG/ML (10ML) SYRINGE FOR IV PUSH (FOR BLOOD PRESSURE SUPPORT)
PREFILLED_SYRINGE | INTRAVENOUS | Status: AC
  Filled 2017-10-01: qty 10

## 2017-10-01 MED ORDER — ATORVASTATIN CALCIUM 20 MG PO TABS
20.0000 mg | ORAL_TABLET | Freq: Every day | ORAL | Status: DC
  Administered 2017-10-01 – 2017-10-02 (×2): 20 mg via ORAL
  Filled 2017-10-01 (×2): qty 1

## 2017-10-01 MED ORDER — VANCOMYCIN HCL 1000 MG IV SOLR
INTRAVENOUS | Status: AC
  Filled 2017-10-01: qty 1000

## 2017-10-01 MED ORDER — ACETAMINOPHEN 500 MG PO TABS
1000.0000 mg | ORAL_TABLET | Freq: Four times a day (QID) | ORAL | Status: AC
  Administered 2017-10-01 – 2017-10-02 (×4): 1000 mg via ORAL
  Filled 2017-10-01 (×4): qty 2

## 2017-10-01 MED ORDER — ONDANSETRON HCL 4 MG/2ML IJ SOLN: 4.0000 mg | Freq: Four times a day (QID) | INTRAMUSCULAR | Status: DC | PRN

## 2017-10-01 MED ORDER — PHENYLEPHRINE HCL 10 MG/ML IJ SOLN
INTRAVENOUS | Status: DC | PRN
  Administered 2017-10-01: 50 ug/min via INTRAVENOUS

## 2017-10-01 MED ORDER — MIDAZOLAM HCL 2 MG/2ML IJ SOLN
INTRAMUSCULAR | Status: AC
Start: 2017-10-01 — End: 2017-10-01
  Filled 2017-10-01: qty 2

## 2017-10-01 MED ORDER — ACETAMINOPHEN 325 MG PO TABS: 325.0000 mg | ORAL_TABLET | ORAL | Status: DC | PRN

## 2017-10-01 MED ORDER — PROPOFOL 10 MG/ML IV BOLUS
INTRAVENOUS | Status: DC | PRN
  Administered 2017-10-01: 110 mg via INTRAVENOUS

## 2017-10-01 MED ORDER — LORATADINE 10 MG PO TABS: 10.0000 mg | ORAL_TABLET | Freq: Every day | ORAL | Status: DC | PRN

## 2017-10-01 MED ORDER — SODIUM CHLORIDE 0.9% FLUSH
3.0000 mL | Freq: Two times a day (BID) | INTRAVENOUS | Status: DC
  Administered 2017-10-02 (×2): 3 mL via INTRAVENOUS

## 2017-10-01 MED ORDER — FENTANYL CITRATE (PF) 250 MCG/5ML IJ SOLN
INTRAMUSCULAR | Status: AC
  Filled 2017-10-01: qty 5

## 2017-10-01 MED ORDER — VANCOMYCIN HCL 1 G IV SOLR
INTRAVENOUS | Status: DC | PRN
  Administered 2017-10-01: 1000 mg via TOPICAL

## 2017-10-01 MED ORDER — BUPIVACAINE-EPINEPHRINE (PF) 0.5% -1:200000 IJ SOLN
INTRAMUSCULAR | Status: AC
  Filled 2017-10-01: qty 30

## 2017-10-01 MED ORDER — SUGAMMADEX SODIUM 200 MG/2ML IV SOLN
INTRAVENOUS | Status: DC | PRN
  Administered 2017-10-01: 200 mg via INTRAVENOUS

## 2017-10-01 MED ORDER — LIDOCAINE HCL (CARDIAC) 20 MG/ML IV SOLN
INTRAVENOUS | Status: DC | PRN
  Administered 2017-10-01: 60 mg via INTRATRACHEAL

## 2017-10-01 MED ORDER — 0.9 % SODIUM CHLORIDE (POUR BTL) OPTIME
TOPICAL | Status: DC | PRN
  Administered 2017-10-01: 1000 mL

## 2017-10-01 MED ORDER — HEMOSTATIC AGENTS (NO CHARGE) OPTIME
TOPICAL | Status: DC | PRN
  Administered 2017-10-01: 1 via TOPICAL

## 2017-10-01 MED ORDER — MIDAZOLAM HCL 5 MG/5ML IJ SOLN
INTRAMUSCULAR | Status: DC | PRN
  Administered 2017-10-01: 1 mg via INTRAVENOUS

## 2017-10-01 MED ORDER — LISINOPRIL 10 MG PO TABS: 10.0000 mg | ORAL_TABLET | Freq: Every day | ORAL | Status: DC

## 2017-10-01 MED ORDER — ZOLPIDEM TARTRATE 5 MG PO TABS
5.0000 mg | ORAL_TABLET | Freq: Every evening | ORAL | Status: DC | PRN
Start: 2017-10-01 — End: 2017-10-03

## 2017-10-01 MED ORDER — PHENYLEPHRINE HCL 10 MG/ML IJ SOLN
INTRAMUSCULAR | Status: DC | PRN
  Administered 2017-10-01: 80 ug via INTRAVENOUS
  Administered 2017-10-01: 160 ug via INTRAVENOUS
  Administered 2017-10-01 (×2): 80 ug via INTRAVENOUS

## 2017-10-01 MED ORDER — SODIUM CHLORIDE 0.9 % IV SOLN
INTRAVENOUS | Status: DC | PRN
  Administered 2017-10-01: 15:00:00

## 2017-10-01 MED ORDER — GABAPENTIN 600 MG PO TABS
600.0000 mg | ORAL_TABLET | Freq: Every morning | ORAL | Status: DC
  Administered 2017-10-02 – 2017-10-03 (×2): 600 mg via ORAL
  Filled 2017-10-01 (×2): qty 1

## 2017-10-01 MED ORDER — ONDANSETRON HCL 4 MG/2ML IJ SOLN
INTRAMUSCULAR | Status: DC | PRN
  Administered 2017-10-01: 4 mg via INTRAVENOUS

## 2017-10-01 MED ORDER — FENTANYL CITRATE (PF) 250 MCG/5ML IJ SOLN
INTRAMUSCULAR | Status: DC | PRN
  Administered 2017-10-01 (×3): 50 ug via INTRAVENOUS
  Administered 2017-10-01: 100 ug via INTRAVENOUS

## 2017-10-01 SURGICAL SUPPLY — 73 items
BAG DECANTER FOR FLEXI CONT (MISCELLANEOUS) ×3 IMPLANT
BENZOIN TINCTURE PRP APPL 2/3 (GAUZE/BANDAGES/DRESSINGS) ×3 IMPLANT
BLADE CLIPPER SURG (BLADE) IMPLANT
BUR MATCHSTICK NEURO 3.0 LAGG (BURR) ×3 IMPLANT
BUR PRECISION FLUTE 6.0 (BURR) ×3 IMPLANT
CAGE ALTERA 10X31X9-13 15D (Cage) ×2 IMPLANT
CAGE ALTERA 9-13-15-31MM (Cage) ×1 IMPLANT
CANISTER SUCT 3000ML PPV (MISCELLANEOUS) ×3 IMPLANT
CAP REVERE LOCKING (Cap) ×12 IMPLANT
CARTRIDGE OIL MAESTRO DRILL (MISCELLANEOUS) ×1 IMPLANT
CLOSURE WOUND 1/2 X4 (GAUZE/BANDAGES/DRESSINGS) ×1
CONT SPEC 4OZ CLIKSEAL STRL BL (MISCELLANEOUS) ×3 IMPLANT
COVER BACK TABLE 60X90IN (DRAPES) ×3 IMPLANT
DECANTER SPIKE VIAL GLASS SM (MISCELLANEOUS) IMPLANT
DIFFUSER DRILL AIR PNEUMATIC (MISCELLANEOUS) ×3 IMPLANT
DRAPE C-ARM 42X72 X-RAY (DRAPES) ×6 IMPLANT
DRAPE HALF SHEET 40X57 (DRAPES) ×6 IMPLANT
DRAPE LAPAROTOMY 100X72X124 (DRAPES) ×3 IMPLANT
DRAPE SURG 17X23 STRL (DRAPES) ×12 IMPLANT
DRSG OPSITE POSTOP 4X6 (GAUZE/BANDAGES/DRESSINGS) ×3 IMPLANT
DRSG OPSITE POSTOP 4X8 (GAUZE/BANDAGES/DRESSINGS) ×3 IMPLANT
ELECT BLADE 4.0 EZ CLEAN MEGAD (MISCELLANEOUS) ×3
ELECT REM PT RETURN 9FT ADLT (ELECTROSURGICAL) ×3
ELECTRODE BLDE 4.0 EZ CLN MEGD (MISCELLANEOUS) ×1 IMPLANT
ELECTRODE REM PT RTRN 9FT ADLT (ELECTROSURGICAL) ×1 IMPLANT
EVACUATOR 1/8 PVC DRAIN (DRAIN) IMPLANT
GAUZE SPONGE 4X4 12PLY STRL (GAUZE/BANDAGES/DRESSINGS) IMPLANT
GAUZE SPONGE 4X4 16PLY XRAY LF (GAUZE/BANDAGES/DRESSINGS) ×3 IMPLANT
GLOVE BIO SURGEON STRL SZ8 (GLOVE) ×9 IMPLANT
GLOVE BIO SURGEON STRL SZ8.5 (GLOVE) ×6 IMPLANT
GLOVE BIOGEL PI IND STRL 6.5 (GLOVE) ×2 IMPLANT
GLOVE BIOGEL PI IND STRL 7.5 (GLOVE) ×1 IMPLANT
GLOVE BIOGEL PI INDICATOR 6.5 (GLOVE) ×4
GLOVE BIOGEL PI INDICATOR 7.5 (GLOVE) ×2
GLOVE EXAM NITRILE LRG STRL (GLOVE) IMPLANT
GLOVE EXAM NITRILE XL STR (GLOVE) IMPLANT
GLOVE EXAM NITRILE XS STR PU (GLOVE) IMPLANT
GLOVE INDICATOR 8.5 STRL (GLOVE) ×3 IMPLANT
GLOVE SURG SS PI 6.0 STRL IVOR (GLOVE) ×12 IMPLANT
GOWN STRL REUS W/ TWL LRG LVL3 (GOWN DISPOSABLE) ×2 IMPLANT
GOWN STRL REUS W/ TWL XL LVL3 (GOWN DISPOSABLE) ×3 IMPLANT
GOWN STRL REUS W/TWL 2XL LVL3 (GOWN DISPOSABLE) IMPLANT
GOWN STRL REUS W/TWL LRG LVL3 (GOWN DISPOSABLE) ×4
GOWN STRL REUS W/TWL XL LVL3 (GOWN DISPOSABLE) ×6
HEMOSTAT POWDER KIT SURGIFOAM (HEMOSTASIS) ×3 IMPLANT
KIT BASIN OR (CUSTOM PROCEDURE TRAY) ×3 IMPLANT
KIT TURNOVER KIT B (KITS) ×3 IMPLANT
MILL MEDIUM DISP (BLADE) ×3 IMPLANT
NEEDLE HYPO 21X1.5 SAFETY (NEEDLE) ×3 IMPLANT
NEEDLE HYPO 22GX1.5 SAFETY (NEEDLE) ×3 IMPLANT
NS IRRIG 1000ML POUR BTL (IV SOLUTION) ×3 IMPLANT
OIL CARTRIDGE MAESTRO DRILL (MISCELLANEOUS) ×3
PACK LAMINECTOMY NEURO (CUSTOM PROCEDURE TRAY) ×3 IMPLANT
PAD ARMBOARD 7.5X6 YLW CONV (MISCELLANEOUS) ×9 IMPLANT
PATTIES SURGICAL .5 X1 (DISPOSABLE) ×3 IMPLANT
PATTIES SURGICAL 1X1 (DISPOSABLE) ×3 IMPLANT
ROD REVERE 6.35 45MM (Rod) ×6 IMPLANT
SCREW 7.5X45MM (Screw) ×12 IMPLANT
SPONGE LAP 4X18 X RAY DECT (DISPOSABLE) IMPLANT
SPONGE NEURO XRAY DETECT 1X3 (DISPOSABLE) IMPLANT
SPONGE SURGIFOAM ABS GEL 100 (HEMOSTASIS) IMPLANT
SPONGE SURGIFOAM ABS GEL SZ50 (HEMOSTASIS) ×3 IMPLANT
STRIP BIOACTIVE 20CC 25X100X8 (Miscellaneous) ×3 IMPLANT
STRIP CLOSURE SKIN 1/2X4 (GAUZE/BANDAGES/DRESSINGS) ×2 IMPLANT
SUT PROLENE 6 0 BV (SUTURE) ×3 IMPLANT
SUT VIC AB 1 CT1 18XBRD ANBCTR (SUTURE) ×2 IMPLANT
SUT VIC AB 1 CT1 8-18 (SUTURE) ×4
SUT VIC AB 2-0 CP2 18 (SUTURE) ×6 IMPLANT
SYR 20CC LL (SYRINGE) ×3 IMPLANT
TOWEL GREEN STERILE (TOWEL DISPOSABLE) ×3 IMPLANT
TOWEL GREEN STERILE FF (TOWEL DISPOSABLE) ×3 IMPLANT
TRAY FOLEY W/METER SILVER 16FR (SET/KITS/TRAYS/PACK) ×3 IMPLANT
WATER STERILE IRR 1000ML POUR (IV SOLUTION) ×3 IMPLANT

## 2017-10-01 NOTE — Op Note (Signed)
Brief history: The patient is a 67 year old black female on whom I performed an L4-5 discectomy.  She has had worsening back pain.  She has failed medical management.  She was worked up with lumbar x-rays and lumbar MRI which demonstrated L4-5 degenerative disc disease, spondylolisthesis, etc.  I discussed the various treatment options with the patient including surgery.  She has weighed the risks, benefits, and alternatives of surgery and decided proceed with an L4-5 decompression, instrumentation and fusion.  Preoperative diagnosis: L4-5 spondylolisthesis, degenerative disc disease, spinal stenosis compressing both the L4 and the L5 nerve roots; lumbago; lumbar radiculopathy  Postoperative diagnosis: The same  Procedure: Bilateral L4-5 laminotomy/foraminotomies to decompress the bilateral L4 and L5 nerve roots(the work required to do this was in addition to the work required to do the posterior lumbar interbody fusion because of the patient's spinal stenosis, facet arthropathy. Etc. requiring a wide decompression of the nerve roots.);  L4-5 transforaminal lumbar interbody fusion with local morselized autograft bone and Kinnex graft extender; insertion of interbody prosthesis at L4-5 (globus peek expandable interbody prosthesis); posterior nonsegmental instrumentation from L4 to L5 with globus titanium pedicle screws and rods; posterior lateral arthrodesis at L4-5 with local morselized autograft bone and Kinnex bone graft extender.  Surgeon: Dr. Delma Officer  Asst.: Dr. Wynetta Emery  Anesthesia: Gen. endotracheal  Estimated blood loss: 200 cc  Drains: None  Complications: None  Description of procedure: The patient was brought to the operating room by the anesthesia team. General endotracheal anesthesia was induced. The patient was turned to the prone position on the Wilson frame. The patient's lumbosacral region was then prepared with Betadine scrub and Betadine solution. Sterile drapes were  applied.  I then injected the area to be incised with Marcaine with epinephrine solution. I then used the scalpel to make a linear midline incision over the L4-5 interspace. I then used electrocautery to perform a bilateral subperiosteal dissection exposing the spinous process and lamina of L4 and L5. We then obtained intraoperative radiograph to confirm our location. We then inserted the Verstrac retractor to provide exposure.  I began the decompression by using the high speed drill to perform laminotomies at L4-5 bilaterally. We then used the Kerrison punches to widen the laminotomy and removed the ligamentum flavum at L4-5 on the right and the scar tissue at L4-5 on the left. We used the Kerrison punches to remove the medial facets at L4-5 bilaterally. We performed wide foraminotomies about the bilateral L4 and L5 nerve roots completing the decompression.  We now turned our attention to the posterior lumbar interbody fusion. I used a scalpel to incise the intervertebral disc at L4-5 bilaterally. I then performed a partial intervertebral discectomy at L4-5 bilaterally using the pituitary forceps. We prepared the vertebral endplates at L4-5 bilaterally for the fusion by removing the soft tissues with the curettes. We then used the trial spacers to pick the appropriate sized interbody prosthesis. We prefilled his prosthesis with a combination of local morselized autograft bone that we obtained during the decompression as well as Kinnex bone graft extender. We inserted the prefilled prosthesis into the interspace at L4-5 from the right, we then expanded the prosthesis. There was a good snug fit of the prosthesis in the interspace. We then filled and the remainder of the intervertebral disc space with local morselized autograft bone and Kinnex. This completed the posterior lumbar interbody arthrodesis.  We now turned attention to the instrumentation. Under fluoroscopic guidance we cannulated the bilateral L4  and L5 pedicles  with the bone probe. We then removed the bone probe. We then tapped the pedicle with a 6.5 millimeter tap. We then removed the tap. We probed inside the tapped pedicle with a ball probe to rule out cortical breaches. We then inserted a 7.5 x 45 millimeter pedicle screw into the L4 and L5 pedicles bilaterally under fluoroscopic guidance. We then palpated along the medial aspect of the pedicles to rule out cortical breaches. There were none. The nerve roots were not injured. We then connected the unilateral pedicle screws with a lordotic rod. We compressed the construct and secured the rod in place with the caps. We then tightened the caps appropriately. This completed the instrumentation from L4-5.  We now turned our attention to the posterior lateral arthrodesis at L4-5 bilaterally. We used the high-speed drill to decorticate the remainder of the facets, pars, transverse process at L4-5 bilaterally. We then applied a combination of local morselized autograft bone and Kinnex bone graft extender over these decorticated posterior lateral structures. This completed the posterior lateral arthrodesis.  We then obtained hemostasis using bipolar electrocautery. We irrigated the wound out with bacitracin solution. We inspected the thecal sac and nerve roots and noted they were well decompressed. We then removed the retractor. We placed vancomycin powder in the wound. We reapproximated patient's thoracolumbar fascia with interrupted #1 Vicryl suture. We reapproximated patient's subcutaneous tissue with interrupted 2-0 Vicryl suture. The reapproximated patient's skin with Steri-Strips and benzoin. The wound was then coated with bacitracin ointment. A sterile dressing was applied. The drapes were removed. The patient was subsequently returned to the supine position where they were extubated by the anesthesia team. He was then transported to the post anesthesia care unit in stable condition. All sponge  instrument and needle counts were reportedly correct at the end of this case.

## 2017-10-01 NOTE — Transfer of Care (Signed)
Immediate Anesthesia Transfer of Care Note  Patient: Braulio ConteMary Louise Corriher  Procedure(s) Performed: POSTERIOR LUMBAR INTERBODY FUSION, INTERBODY PROSTHESIS, POSTERIOR INSTRUMENTATION AND FUSION LUMBAR FOUR- LUMBAR FIVE (N/A Back)  Patient Location: PACU  Anesthesia Type:General  Level of Consciousness: awake and drowsy  Airway & Oxygen Therapy: Patient Spontanous Breathing and Patient connected to nasal cannula oxygen  Post-op Assessment: Report given to RN, Post -op Vital signs reviewed and stable and Patient moving all extremities X 4  Post vital signs: stable  Last Vitals:  Vitals Value Taken Time  BP 128/64 10/01/2017  5:03 PM  Temp 36.3 C 10/01/2017  5:03 PM  Pulse 97 10/01/2017  5:09 PM  Resp 12 10/01/2017  5:09 PM  SpO2 100 % 10/01/2017  5:09 PM  Vitals shown include unvalidated device data.  Last Pain:  Vitals:   10/01/17 1703  TempSrc:   PainSc: (P) 0-No pain      Patients Stated Pain Goal: 3 (10/01/17 1053)  Complications: No apparent anesthesia complications

## 2017-10-01 NOTE — Progress Notes (Signed)
Subjective: The patient is somnolent but arousable.  She is in no apparent distress.  She looks well.  Objective: Vital signs in last 24 hours: Temp:  [97.4 F (36.3 C)-97.7 F (36.5 C)] 97.4 F (36.3 C) (04/08 1703) Pulse Rate:  [67-96] 96 (04/08 1703) Resp:  [14-18] 14 (04/08 1703) BP: (117-128)/(54-64) 128/64 (04/08 1703) SpO2:  [99 %] 99 % (04/08 1703) Weight:  [85.3 kg (188 lb)] 85.3 kg (188 lb) (04/08 1037) Estimated body mass index is 30.34 kg/m as calculated from the following:   Height as of 09/24/17: 5\' 6"  (1.676 m).   Weight as of this encounter: 85.3 kg (188 lb).   Intake/Output from previous day: No intake/output data recorded. Intake/Output this shift: Total I/O In: 1550 [I.V.:1300; IV Piggyback:250] Out: 995 [Urine:795; Blood:200]  Physical exam the patient is somnolent but arousable.  She is moving her lower extremities well.  Lab Results: No results for input(s): WBC, HGB, HCT, PLT in the last 72 hours. BMET No results for input(s): NA, K, CL, CO2, GLUCOSE, BUN, CREATININE, CALCIUM in the last 72 hours.  Studies/Results: Dg Lumbar Spine 2-3 Views  Result Date: 10/01/2017 CLINICAL DATA:  Intraoperative fluoroscopy. Lumbar interbody fusion. EXAM: DG C-ARM 61-120 MIN; LUMBAR SPINE - 2-3 VIEW COMPARISON:  None. FLUOROSCOPY TIME:  Fluoroscopy Time:  23 seconds Number of Acquired Spot Images: 0 FINDINGS: Intraoperative images demonstrate discectomy and spacer at L4-5. Pedicle screws are in place bilaterally at L4 and L5. IMPRESSION: Discectomy in pedicle screw placement without radiographic evidence for complication. Electronically Signed   By: Marin Robertshristopher  Mattern M.D.   On: 10/01/2017 16:50   Dg Lumbar Spine 1 View  Result Date: 10/01/2017 CLINICAL DATA:  Localization. EXAM: LUMBAR SPINE - 1 VIEW COMPARISON:  Plain films 04/24/2017. FINDINGS: Portable cross-table lateral view demonstrates a probe directed most closely toward L4-5. IMPRESSION: L4-5 marked.  Electronically Signed   By: Elsie StainJohn T Curnes M.D.   On: 10/01/2017 16:42   Dg C-arm 1-60 Min  Result Date: 10/01/2017 CLINICAL DATA:  Intraoperative fluoroscopy. Lumbar interbody fusion. EXAM: DG C-ARM 61-120 MIN; LUMBAR SPINE - 2-3 VIEW COMPARISON:  None. FLUOROSCOPY TIME:  Fluoroscopy Time:  23 seconds Number of Acquired Spot Images: 0 FINDINGS: Intraoperative images demonstrate discectomy and spacer at L4-5. Pedicle screws are in place bilaterally at L4 and L5. IMPRESSION: Discectomy in pedicle screw placement without radiographic evidence for complication. Electronically Signed   By: Marin Robertshristopher  Mattern M.D.   On: 10/01/2017 16:50    Assessment/Plan: The patient is doing well.  I spoke with her sons.  LOS: 0 days     Cristi LoronJeffrey D Soraida Vickers 10/01/2017, 5:16 PM

## 2017-10-01 NOTE — Anesthesia Procedure Notes (Signed)
Procedure Name: Intubation Date/Time: 10/01/2017 1:47 PM Performed by: Mariea Clonts, CRNA Pre-anesthesia Checklist: Patient identified, Emergency Drugs available, Suction available and Patient being monitored Patient Re-evaluated:Patient Re-evaluated prior to induction Oxygen Delivery Method: Circle System Utilized Preoxygenation: Pre-oxygenation with 100% oxygen Induction Type: IV induction Ventilation: Mask ventilation without difficulty Laryngoscope Size: Mac and 3 Grade View: Grade I Tube type: Oral Tube size: 7.0 mm Number of attempts: 1 Airway Equipment and Method: Stylet Placement Confirmation: ETT inserted through vocal cords under direct vision,  positive ETCO2 and breath sounds checked- equal and bilateral Secured at: 21 cm Tube secured with: Tape Dental Injury: Teeth and Oropharynx as per pre-operative assessment

## 2017-10-01 NOTE — Anesthesia Preprocedure Evaluation (Signed)
Anesthesia Evaluation  Patient identified by MRN, date of birth, ID band Patient awake    Reviewed: Allergy & Precautions, NPO status , Patient's Chart, lab work & pertinent test results  History of Anesthesia Complications Negative for: history of anesthetic complications  Airway Mallampati: II  TM Distance: >3 FB Neck ROM: Full    Dental  (+) Partial Upper   Pulmonary neg shortness of breath, sleep apnea and Continuous Positive Airway Pressure Ventilation , neg COPD, former smoker,    breath sounds clear to auscultation       Cardiovascular hypertension, Pt. on medications + Valvular Problems/Murmurs  Rhythm:Regular + Systolic murmurs    Neuro/Psych  Headaches,  Neuromuscular disease negative psych ROS   GI/Hepatic Neg liver ROS, GERD  Medicated and Controlled,  Endo/Other  diabetes  Renal/GU negative Renal ROS     Musculoskeletal  (+) Arthritis ,   Abdominal   Peds  Hematology negative hematology ROS (+)   Anesthesia Other Findings  PMH includes: HTN, DM, hyperthyroidism, aortic stenosis (mild by8/2018 echo), GERD. Former smoker. BMI 30. S/p lumbar microdiscectomy 10/04/16  Medications include: ASA 81 mg, Lipitor, lisinopril, Prilosec, zantac  BP 129/84   Pulse 80   Temp 36.6 C   Resp 20   Ht _0  (1.676 m)   Wt 188 lb 1.6 oz (85.3 kg)   SpO2 98%   BMI 30.36 kg/m    Preoperative labs reviewed.HbA1c 5.6, glucose 136  EKG 09/24/17: NSR. Inferior infarct, age undetermined. Appears stable when compared to EKG 05/16/16 (in correspondence 10/06/16 in media tab)  Echo 02/06/17 [facility] cardiology [place]): 1.  Normal LV size and systolic function.  EF 60-65%.  Mild LVH. 2.  Mildly dilated LA. 3.  Mild aortic stenosis with moderate aortic regurgitation. 4.  Moderate MAC. 5.  Trace mitral and tricuspid regurgitation.  Nuclear stress test5/18/17 (correspondence 10/06/16 in media tab): 1. No  ischemia seen on scan. 2. Normal gated images. 3. Normal EF 71%.    Reproductive/Obstetrics                             Anesthesia Physical Anesthesia Plan  ASA: III  Anesthesia Plan: General   Post-op Pain Management:    Induction: Intravenous  PONV Risk Score and Plan: 3 and Ondansetron and Dexamethasone  Airway Management Planned: Oral ETT  Additional Equipment: None  Intra-op Plan:   Post-operative Plan: Extubation in OR  Informed Consent: I have reviewed the patients History and Physical, chart, labs and discussed the procedure including the risks, benefits and alternatives for the proposed anesthesia with the patient or authorized representative who has indicated his/her understanding and acceptance.   Dental advisory given  Plan Discussed with: Surgeon and CRNA  Anesthesia Plan Comments:         Anesthesia Quick Evaluation

## 2017-10-01 NOTE — H&P (Signed)
Subjective: The patient is a 66 year old black female on whom I previously performed a lumbar discectomy.  She has developed recurrent back and leg pain.  She has failed medical management.  She was worked up with a lumbar MRI which demonstrated disc degeneration most prominent at L4-5.  I discussed the various treatment options including surgery.  She has weighed the risks, benefits, and alternatives surgery and decided to proceed with an L4-5 decompression, instrumentation, and fusion.  Past Medical History:  Diagnosis Date  . DDD (degenerative disc disease), lumbar   . 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 GERD (gastroesophageal reflux disease)   . Headache   . Heart murmur    Echo-  04/2016- mild aortic stenosis  . Hypertension   . Hyperthyroidism    goiter  . Neuropathy    feet  . Pneumonia 2017  . Sleep apnea    cpap  does not use   (done in [place])    Past Surgical History:  Procedure Laterality Date  . COLONOSCOPY W/ POLYPECTOMY    . LUMBAR LAMINECTOMY/DECOMPRESSION MICRODISCECTOMY Left 10/04/2016   Procedure: MICRODISCECTOMY LEFT LUMBAR FOUR- LUMBAR FIVE;  Surgeon: Tressie Stalker, MD;  Location: [facility];  Service: Neurosurgery;  Laterality: Left;  MICRODISCECTOMY LEFT LUMBAR 4- LUMBAR 5  . PARTIAL NEPHRECTOMY Left 05/26/2016   stent  . UPPER GI ENDOSCOPY      No Known Allergies  Social History   Tobacco Use  . Smoking status: Former Smoker    Years: 55.00  . Smokeless tobacco: Never Used  . Tobacco comment: Quick  2015  Substance Use Topics  . Alcohol use: No    History reviewed. No pertinent family history. Prior to Admission medications   Medication Sig Start Date End Date Taking? Authorizing Provider  aspirin EC 81 MG tablet Take 81 mg by mouth daily.   Yes [provider]  atorvastatin (LIPITOR) 20 MG tablet Take 20 mg by mouth at bedtime.    Yes [provider]  gabapentin (NEURONTIN) 600  MG tablet Take 600-1,200 mg by mouth See admin instructions. Take 600 mg in the morning and 1200 mg at night   Yes [provider]  lisinopril (PRINIVIL,ZESTRIL) 20 MG tablet Take 10 mg by mouth daily.   Yes [provider]  loratadine (CLARITIN) 10 MG tablet Take 10 mg by mouth daily as needed for allergies.   Yes [provider]  omeprazole (PRILOSEC) 40 MG capsule Take 40 mg by mouth daily before breakfast.    Yes [provider]  oxyCODONE-acetaminophen (PERCOCET) 7.5-325 MG tablet Take 1 tablet by mouth every 8 (eight) hours as needed for moderate pain or severe pain.   Yes [provider]  Polyethyl Glycol-Propyl Glycol (SYSTANE ULTRA HOME-AWAY PACK OP) Apply 1 drop to eye daily.   Yes [provider]  polyethylene glycol (MIRALAX / GLYCOLAX) packet Take 17 g by mouth daily as needed for mild constipation.    Yes [provider]  ranitidine (ZANTAC) 300 MG tablet Take 300 mg by mouth at bedtime.   Yes [provider]  SUPER B COMPLEX/C PO Take 1 tablet by mouth daily.   Yes [provider]  traZODone (DESYREL) 50 MG tablet Take 150 mg by mouth at bedtime.   Yes [provider]     Review of Systems  Positive ROS: As above  All other systems have been reviewed and were otherwise negative with  the exception of those mentioned in the HPI and as above.  Objective: Vital signs in last 24 hours: Temp:  [97.7 F (36.5 C)] 97.7 F (36.5 C) (04/08 1037) Pulse Rate:  [67] 67 (04/08 1037) Resp:  [18] 18 (04/08 1037) BP: (117)/(54) 117/54 (04/08 1037) SpO2:  [99 %] 99 % (04/08 1037) Weight:  [85.3 kg (188 lb)] 85.3 kg (188 lb) (04/08 1037) Estimated body mass index is 30.34 kg/m as calculated from the following:   Height as of 09/24/17: 5\' 6"  (1.676 m).   Weight as of this encounter: 85.3 kg (188 lb).   General Appearance: Alert Head: Normocephalic, without obvious abnormality, atraumatic Eyes:  PERRL, conjunctiva/corneas clear, EOM's intact,    Ears: Normal  Throat: Normal  Neck: Supple, the patient's cervical incision is well-healed. Back: unremarkable.  The patient's lumbar incision is well-healed. Lungs: Clear to auscultation bilaterally, respirations unlabored Heart: Regular rate and rhythm, no murmur, rub or gallop Abdomen: Soft, non-tender Extremities: Extremities normal, atraumatic, no cyanosis or edema Skin: unremarkable  NEUROLOGIC:   Mental status: alert and oriented,Motor Exam - grossly normal Sensory Exam - grossly normal Reflexes:  Coordination - grossly normal Gait - grossly normal Balance - grossly normal Cranial Nerves: I: smell Not tested  II: visual acuity  OS: Normal  OD: Normal   II: visual fields Full to confrontation  II: pupils Equal, round, reactive to light  III,VII: ptosis None  III,IV,VI: extraocular muscles  Full ROM  V: mastication Normal  V: facial light touch sensation  Normal  V,VII: corneal reflex  Present  VII: facial muscle function - upper  Normal  VII: facial muscle function - lower Normal  VIII: hearing Not tested  IX: soft palate elevation  Normal  IX,X: gag reflex Present  XI: trapezius strength  5/5  XI: sternocleidomastoid strength 5/5  XI: neck flexion strength  5/5  XII: tongue strength  Normal    Data Review Lab Results  Component Value Date   WBC 7.4 09/24/2017   HGB 12.9 09/24/2017   HCT 39.8 09/24/2017   MCV 95.9 09/24/2017   PLT 226 09/24/2017   Lab Results  Component Value Date   NA 140 09/24/2017   K 4.1 09/24/2017   CL 104 09/24/2017   CO2 27 09/24/2017   BUN 10 09/24/2017   CREATININE 1.15 (H) 09/24/2017   GLUCOSE 136 (H) 09/24/2017   No results found for: INR, PROTIME  Assessment/Plan: L4-5 degenerative disc disease, lumbago, lumbar radiculopathy: I have discussed the situation with the patient.  I have reviewed her imaging studies with her and pointed out the abnormalities.  We have  discussed the various treatment options including surgery.  I have described the surgical treatment option of an L4-5 decompression, instrumentation, and fusion.  I have shown her surgical models.  I have given her a surgical pamphlet.  We have discussed the risks, benefits, alternatives, expected postoperative course, and likelihood of achieving our goals with surgery.  I have answered all the patient's questions.  She has decided to proceed with surgery.   Cristi LoronJeffrey D Madline Oesterling 10/01/2017 1:31 PM

## 2017-10-02 LAB — BASIC METABOLIC PANEL
Anion gap: 8 (ref 5–15)
BUN: 14 mg/dL (ref 6–20)
CHLORIDE: 106 mmol/L (ref 101–111)
CO2: 25 mmol/L (ref 22–32)
CREATININE: 1.13 mg/dL — AB (ref 0.44–1.00)
Calcium: 8.5 mg/dL — ABNORMAL LOW (ref 8.9–10.3)
GFR calc Af Amer: 57 mL/min — ABNORMAL LOW (ref >60)
GFR calc non Af Amer: 49 mL/min — ABNORMAL LOW (ref >60)
Glucose, Bld: 120 mg/dL — ABNORMAL HIGH (ref 65–99)
Potassium: 5 mmol/L (ref 3.5–5.1)
SODIUM: 139 mmol/L (ref 135–145)

## 2017-10-02 LAB — GLUCOSE, CAPILLARY
GLUCOSE-CAPILLARY: 101 mg/dL — AB (ref 65–99)
Glucose-Capillary: 141 mg/dL — ABNORMAL HIGH (ref 65–99)
Glucose-Capillary: 139 mg/dL — ABNORMAL HIGH (ref 65–99)
Glucose-Capillary: 119 mg/dL — ABNORMAL HIGH (ref 65–99)

## 2017-10-02 LAB — CBC
HCT: 32.2 % — ABNORMAL LOW (ref 36.0–46.0)
HEMOGLOBIN: 10.7 g/dL — AB (ref 12.0–15.0)
MCH: 31.3 pg (ref 26.0–34.0)
MCHC: 33.2 g/dL (ref 30.0–36.0)
MCV: 94.2 fL (ref 78.0–100.0)
PLATELETS: 181 10*3/uL (ref 150–400)
RBC: 3.42 MIL/uL — ABNORMAL LOW (ref 3.87–5.11)
RDW: 12.3 % (ref 11.5–15.5)
WBC: 14.5 10*3/uL — ABNORMAL HIGH (ref 4.0–10.5)

## 2017-10-02 MED ORDER — METHOCARBAMOL 500 MG PO TABS
500.0000 mg | ORAL_TABLET | Freq: Four times a day (QID) | ORAL | Status: DC | PRN
Start: 2017-10-02 — End: 2017-10-03
  Administered 2017-10-02 – 2017-10-03 (×4): 500 mg via ORAL
  Filled 2017-10-02 (×5): qty 1

## 2017-10-02 NOTE — Progress Notes (Signed)
Subjective: The patient is alert and pleasant.  She is in no apparent distress.  She looks well.  Objective: Vital signs in last 24 hours: Temp:  [97.4 F (36.3 C)-98.4 F (36.9 C)] 98.4 F (36.9 C) (04/09 0532) Pulse Rate:  [67-98] 73 (04/09 0621) Resp:  [12-20] 18 (04/08 2314) BP: (94-134)/(45-66) 98/45 (04/09 0621) SpO2:  [94 %-100 %] 95 % (04/09 0532) Weight:  [85.3 kg (188 lb)] 85.3 kg (188 lb) (04/08 1037) Estimated body mass index is 30.34 kg/m as calculated from the following:   Height as of 09/24/17: 5\' 6"  (1.676 m).   Weight as of this encounter: 85.3 kg (188 lb).   Intake/Output from previous day: 04/08 0701 - 04/09 0700 In: 1870 [P.O.:120; I.V.:1300; IV Piggyback:450] Out: 1145 [Urine:945; Blood:200] Intake/Output this shift: No intake/output data recorded.  Physical exam the patient is alert and pleasant.  Her strength is normal in her lower extremities.  Lab Results: No results for input(s): WBC, HGB, HCT, PLT in the last 72 hours. BMET No results for input(s): NA, K, CL, CO2, GLUCOSE, BUN, CREATININE, CALCIUM in the last 72 hours.  Studies/Results: Dg Lumbar Spine 2-3 Views  Result Date: 10/01/2017 CLINICAL DATA:  Intraoperative fluoroscopy. Lumbar interbody fusion. EXAM: DG C-ARM 61-120 MIN; LUMBAR SPINE - 2-3 VIEW COMPARISON:  None. FLUOROSCOPY TIME:  Fluoroscopy Time:  23 seconds Number of Acquired Spot Images: 0 FINDINGS: Intraoperative images demonstrate discectomy and spacer at L4-5. Pedicle screws are in place bilaterally at L4 and L5. IMPRESSION: Discectomy in pedicle screw placement without radiographic evidence for complication. Electronically Signed   By: Marin Robertshristopher  Mattern M.D.   On: 10/01/2017 16:50   Dg Lumbar Spine 1 View  Result Date: 10/01/2017 CLINICAL DATA:  Localization. EXAM: LUMBAR SPINE - 1 VIEW COMPARISON:  Plain films 04/24/2017. FINDINGS: Portable cross-table lateral view demonstrates a probe directed most closely toward L4-5.  IMPRESSION: L4-5 marked. Electronically Signed   By: Elsie StainJohn T Curnes M.D.   On: 10/01/2017 16:42   Dg C-arm 1-60 Min  Result Date: 10/01/2017 CLINICAL DATA:  Intraoperative fluoroscopy. Lumbar interbody fusion. EXAM: DG C-ARM 61-120 MIN; LUMBAR SPINE - 2-3 VIEW COMPARISON:  None. FLUOROSCOPY TIME:  Fluoroscopy Time:  23 seconds Number of Acquired Spot Images: 0 FINDINGS: Intraoperative images demonstrate discectomy and spacer at L4-5. Pedicle screws are in place bilaterally at L4 and L5. IMPRESSION: Discectomy in pedicle screw placement without radiographic evidence for complication. Electronically Signed   By: Marin Robertshristopher  Mattern M.D.   On: 10/01/2017 16:50    Assessment/Plan: Postop day #1: The patient is doing well.  We will mobilize her with physical therapy.  She may go home later on today.  I gave her discharge instructions and answered her questions.  LOS: 1 day     Cristi LoronJeffrey D Laural Eiland 10/02/2017, 7:33 AM

## 2017-10-02 NOTE — Evaluation (Signed)
Physical Therapy Evaluation Patient Details Name: Laura Fox MRN: [id number] DOB: Mar 21, 1951 Today's Date: 10/02/2017   History of Present Illness  Pt is 67 yo female with h/o L4-5 discectomy one year ago who returns with pain and is now for fusion L4-5. PMH: DM, GERD, HTN, murmur, neuropathy.  Clinical Impression  Pt admitted with above diagnosis. Pt currently with functional limitations due to the deficits listed below (see PT Problem List). Pt ambulated 250' with RW and supervision. Had increased pain with bed mobility and needed min A to return to bed. Reviewed back precautions and expected activity level for today.  Pt will benefit from skilled PT to increase their independence and safety with mobility to allow discharge to the venue listed below.       Follow Up Recommendations Home health PT    Equipment Recommendations  None recommended by PT    Recommendations for Other Services       Precautions / Restrictions Precautions Precautions: Back Precaution Booklet Issued: Yes (comment) Precaution Comments: reviewed back precautions Required Braces or Orthoses: Spinal Brace Spinal Brace: Lumbar corset;Applied in sitting position Restrictions Weight Bearing Restrictions: No      Mobility  Bed Mobility Overal bed mobility: Needs Assistance Bed Mobility: Rolling;Sidelying to Sit;Sit to Sidelying Rolling: Supervision Sidelying to sit: Supervision     Sit to sidelying: Min assist General bed mobility comments: pt able to log roll to L and rise to sitting without physical assist. However with return to bed, needed min A for LE's into bed and pt with increased pain with lying down  Transfers Overall transfer level: Needs assistance Equipment used: Rolling walker (2 wheeled) Transfers: Sit to/from Stand Sit to Stand: Min guard         General transfer comment: vc's for hand placement, min-guard A for safety  Ambulation/Gait Ambulation/Gait assistance:  Supervision Ambulation Distance (Feet): 250 Feet Assistive device: Rolling walker (2 wheeled) Gait Pattern/deviations: Step-through pattern;Antalgic Gait velocity: decreased Gait velocity interpretation: <1.8 ft/sec, indicative of risk for recurrent falls General Gait Details: pt with very slow gait. 2 standing rest breaks.  Stairs            Wheelchair Mobility    Modified Rankin (Stroke Patients Only)       Balance Overall balance assessment: Mild deficits observed, not formally tested                                           Pertinent Vitals/Pain Pain Assessment: Faces Faces Pain Scale: Hurts even more Pain Location: back  Pain Descriptors / Indicators: Sore;Operative site guarding Pain Intervention(s): Limited activity within patient's tolerance;Monitored during session    Home Living Family/patient expects to be discharged to:: Private residence Living Arrangements: Other relatives Available Help at Discharge: Family;Available PRN/intermittently Type of Home: House Home Access: Stairs to enter Entrance Stairs-Rails: Right;Left;Can reach both Entrance Stairs-Number of Steps: 3 Home Layout: One level Home Equipment: Environmental consultant - 2 wheels Additional Comments: pt's grandson lives with her but he works a Special educational needs teacher Level of Independence: Independent         Comments: drives, grocery shops, cooks, though grandson has been helping her recently due to pain     Hand Dominance        Extremity/Trunk Assessment   Upper Extremity Assessment Upper Extremity Assessment: Overall WFL for tasks assessed    Lower Extremity  Assessment Lower Extremity Assessment: Overall WFL for tasks assessed    Cervical / Trunk Assessment Cervical / Trunk Assessment: Kyphotic  Communication   Communication: No difficulties  Cognition Arousal/Alertness: Awake/alert Behavior During Therapy: WFL for tasks assessed/performed Overall Cognitive Status:  Within Functional Limits for tasks assessed                                        General Comments      Exercises     Assessment/Plan    PT Assessment Patient needs continued PT services  PT Problem List Decreased activity tolerance;Decreased balance;Decreased mobility;Pain       PT Treatment Interventions DME instruction;Gait training;Stair training;Functional mobility training;Therapeutic activities;Therapeutic exercise;Balance training;Patient/family education    PT Goals (Current goals can be found in the Care Plan section)  Acute Rehab PT Goals Patient Stated Goal: return home PT Goal Formulation: With patient Time For Goal Achievement: 10/09/17 Potential to Achieve Goals: Good    Frequency Min 5X/week   Barriers to discharge Decreased caregiver support      Co-evaluation               AM-PAC PT "6 Clicks" Daily Activity  Outcome Measure Difficulty turning over in bed (including adjusting bedclothes, sheets and blankets)?: A Little Difficulty moving from lying on back to sitting on the side of the bed? : A Little Difficulty sitting down on and standing up from a chair with arms (e.g., wheelchair, bedside commode, etc,.)?: A Little Help needed moving to and from a bed to chair (including a wheelchair)?: A Little Help needed walking in hospital room?: A Little Help needed climbing 3-5 steps with a railing? : A Little 6 Click Score: 18    End of Session Equipment Utilized During Treatment: Back brace Activity Tolerance: Patient tolerated treatment well Patient left: in bed;with call bell/phone within reach Nurse Communication: Mobility status PT Visit Diagnosis: Unsteadiness on feet (R26.81);Pain;Other abnormalities of gait and mobility (R26.89) Pain - part of body: (back)    Time: [id number] PT Time Calculation (min) (ACUTE ONLY): 27 min   Charges:   PT Evaluation $PT Eval Low Complexity: 1 Low PT Treatments $Gait Training: 8-22  mins   PT G Codes:        Lyanne CoVictoria Kensie Susman, PT  Acute Rehab Services  [phone]   [place]banksVictoria L Tanylah Schnoebelen 10/02/2017, 9:56 AM

## 2017-10-02 NOTE — Progress Notes (Signed)
Occupational Therapy Evaluation Patient Details Name: Laura Fox MRN: [id number] DOB: 22-May-1951 Today's Date: 10/02/2017    History of Present Illness Pt is 67 yo female with h/o L4-5 discectomy one year ago who returns with pain and is now for fusion L4-5. PMH: DM, GERD, HTN, murmur, neuropathy.   Clinical Impression   PTA, pt lived at home with her grandson and was independent with ADL and IADL tasks. Pt experiencing "spasms" during session - nsg aware. Pt guarded with movement but overall doing well @ RW level. Requires min A with LB ADL. Will plan to see again prior to DC to complete education on compensatory techniques and use of AE and DME to maximize independence and reduce risk of falls. Given pt's limited caregiver support and her need to return to independence, recommend follow up HHOT.     Follow Up Recommendations  Home health OT;Supervision - Intermittent    Equipment Recommendations  3 in 1 bedside commode    Recommendations for Other Services       Precautions / Restrictions Precautions Precautions: Back Precaution Booklet Issued: Yes (comment) Precaution Comments: reviewed back precautions Required Braces or Orthoses: Spinal Brace Spinal Brace: Lumbar corset;Applied in sitting position Restrictions Weight Bearing Restrictions: No      Mobility Bed Mobility Overal bed mobility: Needs Assistance Bed Mobility: Rolling;Sidelying to Sit;Sit to Sidelying Rolling: Supervision Sidelying to sit: Supervision     Sit to sidelying: Min assist General bed mobility comments: pt able to log roll to L and rise to sitting without physical assist. However with return to bed, needed min A for LE's into bed and pt with increased pain with lying down  Transfers Overall transfer level: Needs assistance Equipment used: Rolling walker (2 wheeled) Transfers: Sit to/from Stand Sit to Stand: Min guard         General transfer comment: vc's for hand placement,  min-guard A for safety    Balance Overall balance assessment: Mild deficits observed, not formally tested                                         ADL either performed or assessed with clinical judgement   ADL Overall ADL's : Needs assistance/impaired     Grooming: Supervision/safety;Set up;Standing   Upper Body Bathing: Supervision/ safety;Set up;Sitting   Lower Body Bathing: Moderate assistance;Sit to/from stand   Upper Body Dressing : Supervision/safety;Set up;Sitting   Lower Body Dressing: Moderate assistance;Sit to/from stand   Toilet Transfer: Min guard;RW;Ambulation;Comfort height toilet   Toileting- Clothing Manipulation and Hygiene: Minimal assistance;Sit to/from stand       Functional mobility during ADLs: Min guard;Rolling walker;Cueing for safety General ADL Comments: Began education on AE adn compensatory techniques for ADL     Vision         Perception     Praxis      Pertinent Vitals/Pain Pain Assessment: 0-10 Pain Score: 6  Faces Pain Scale: Hurts even more Pain Location: back  Pain Descriptors / Indicators: Sore;Operative site guarding;Spasm Pain Intervention(s): Limited activity within patient's tolerance     Hand Dominance Right   Extremity/Trunk Assessment Upper Extremity Assessment Upper Extremity Assessment: Overall WFL for tasks assessed   Lower Extremity Assessment Lower Extremity Assessment: Defer to PT evaluation   Cervical / Trunk Assessment Cervical / Trunk Assessment: Kyphotic   Communication Communication Communication: No difficulties   Cognition Arousal/Alertness: Awake/alert Behavior During  Therapy: WFL for tasks assessed/performed Overall Cognitive Status: Within Functional Limits for tasks assessed                                     General Comments       Exercises     Shoulder Instructions      Home Living Family/patient expects to be discharged to:: Private  residence Living Arrangements: Other relatives Available Help at Discharge: Family;Available PRN/intermittently Type of Home: House Home Access: Stairs to enter Entergy CorporationEntrance Stairs-Number of Steps: 3 Entrance Stairs-Rails: Right;Left;Can reach both Home Layout: One level     Bathroom Shower/Tub: Chief Strategy OfficerTub/shower unit   Bathroom Toilet: Standard Bathroom Accessibility: No   Home Equipment: Environmental consultantWalker - 2 wheels   Additional Comments: pt's grandson lives with her but he works a Theatre stage managerlot      Prior Functioning/Environment Level of Independence: Independent        Comments: drives, grocery shops, cooks, though grandson has been helping her recently due to pain        OT Problem List: Decreased activity tolerance;Decreased safety awareness;Decreased knowledge of use of DME or AE;Decreased knowledge of precautions;Pain      OT Treatment/Interventions: Self-care/ADL training;DME and/or AE instruction;Therapeutic activities;Patient/family education    OT Goals(Current goals can be found in the care plan section) Acute Rehab OT Goals Patient Stated Goal: return home OT Goal Formulation: With patient Time For Goal Achievement: 10/16/17 Potential to Achieve Goals: Good  OT Frequency: Min 2X/week   Barriers to D/C:    unsure of caregiver support       Co-evaluation              AM-PAC PT "6 Clicks" Daily Activity     Outcome Measure Help from another person eating meals?: None Help from another person taking care of personal grooming?: None Help from another person toileting, which includes using toliet, bedpan, or urinal?: A Little Help from another person bathing (including washing, rinsing, drying)?: A Little Help from another person to put on and taking off regular upper body clothing?: A Little Help from another person to put on and taking off regular lower body clothing?: A Little 6 Click Score: 20   End of Session Equipment Utilized During Treatment: Gait belt;Rolling  walker;Back brace Nurse Communication: Mobility status  Activity Tolerance: Patient tolerated treatment well Patient left: in bed;with call bell/phone within reach  OT Visit Diagnosis: Unsteadiness on feet (R26.81);Muscle weakness (generalized) (M62.81);Pain Pain - part of body: (back)                Time: [id number] OT Time Calculation (min): 23 min Charges:  OT General Charges $OT Visit: 1 Visit OT Evaluation $OT Eval Low Complexity: 1 Low G-Codes:     Chyane Greer, OT/L  [phone] 10/02/2017  Jakaree Pickard,HILLARY 10/02/2017, 11:16 AM

## 2017-10-02 NOTE — Anesthesia Postprocedure Evaluation (Signed)
Anesthesia Post Note  Patient: Braulio ConteMary Louise Marcin  Procedure(s) Performed: POSTERIOR LUMBAR INTERBODY FUSION, INTERBODY PROSTHESIS, POSTERIOR INSTRUMENTATION AND FUSION LUMBAR FOUR- LUMBAR FIVE (N/A Back)     Patient location during evaluation: PACU Anesthesia Type: General Level of consciousness: awake and alert Pain management: pain level controlled Vital Signs Assessment: post-procedure vital signs reviewed and stable Respiratory status: spontaneous breathing, nonlabored ventilation, respiratory function stable and patient connected to nasal cannula oxygen Cardiovascular status: blood pressure returned to baseline and stable Postop Assessment: no apparent nausea or vomiting Anesthetic complications: no    Last Vitals:  Vitals:   10/02/17 1643 10/02/17 1925  BP: (!) 95/37 (!) 102/52  Pulse: 92 85  Resp: 18 18  Temp: 37.2 C 37.2 C  SpO2: 93% 91%    Last Pain:  Vitals:   10/02/17 2114  TempSrc:   PainSc: 8                  Icelyn Navarrete

## 2017-10-03 LAB — GLUCOSE, CAPILLARY
GLUCOSE-CAPILLARY: 115 mg/dL — AB (ref 65–99)
Glucose-Capillary: 82 mg/dL (ref 65–99)

## 2017-10-03 MED ORDER — OXYCODONE HCL 10 MG PO TABS: 10.0000 mg | ORAL_TABLET | ORAL | 0 refills | Status: AC | PRN

## 2017-10-03 MED ORDER — CYCLOBENZAPRINE HCL 10 MG PO TABS: 10.0000 mg | ORAL_TABLET | Freq: Three times a day (TID) | ORAL | 1 refills | Status: AC | PRN

## 2017-10-03 MED FILL — Sodium Chloride IV Soln 0.9%: INTRAVENOUS | Qty: 2000 | Status: AC

## 2017-10-03 MED FILL — Heparin Sodium (Porcine) Inj 1000 Unit/ML: INTRAMUSCULAR | Qty: 30 | Status: AC

## 2017-10-03 NOTE — Discharge Summary (Signed)
Physician Discharge Summary  Patient ID: Laura Fox MRN: [id number] DOB/AGE: [birth year] 67 y.o.  Admit date: 10/01/2017 Discharge date: 10/03/2017  Admission Diagnoses: L4-5 spondylolisthesis, degenerative disc disease, lumbago, lumbar radiculopathy  Discharge Diagnoses: The same Active Problems:   Spondylolisthesis of lumbar region   Discharged Condition: good  Hospital Course: I performed an L4-5 decompression, instrumentation, and fusion on the patient on 10/01/2017.  The surgery went well.  The patient's postoperative course was unremarkable.  On postoperative day #2 she requested discharge home.  She was given written and oral discharge instructions.  All her questions were answered.  Consults: Physical therapy Significant Diagnostic Studies: None Treatments: L4-5 decompression, instrumentation, and fusion. Discharge Exam: Blood pressure (!) 126/56, pulse 95, temperature 99.3 F (37.4 C), temperature source Oral, resp. rate 16, weight 85.3 kg (188 lb), SpO2 91 %. The patient is alert and pleasant.  Her strength is grossly normal in her lower extremities.  Her dressing has a small bloodstain.  Disposition: Home  Discharge Instructions    Call MD for:  difficulty breathing, headache or visual disturbances   Complete by:  As directed    Call MD for:  extreme fatigue   Complete by:  As directed    Call MD for:  hives   Complete by:  As directed    Call MD for:  persistant dizziness or light-headedness   Complete by:  As directed    Call MD for:  persistant nausea and vomiting   Complete by:  As directed    Call MD for:  redness, tenderness, or signs of infection (pain, swelling, redness, odor or green/yellow discharge around incision site)   Complete by:  As directed    Call MD for:  severe uncontrolled pain   Complete by:  As directed    Call MD for:  temperature >100.4   Complete by:  As directed    Diet - low sodium heart healthy   Complete by:  As directed    Discharge instructions   Complete by:  As directed    Call [phone] for a followup appointment. Take a stool softener while you are using pain medications.   Driving Restrictions   Complete by:  As directed    Do not drive for 2 weeks.   Increase activity slowly   Complete by:  As directed    Lifting restrictions   Complete by:  As directed    Do not lift more than 5 pounds. No excessive bending or twisting.   May shower / Bathe   Complete by:  As directed    Remove the dressing for 3 days after surgery.  You may shower, but leave the incision alone.   Remove dressing in 24 hours   Complete by:  As directed      Allergies as of 10/03/2017   No Known Allergies     Medication List    STOP taking these medications   oxyCODONE-acetaminophen 7.5-325 MG tablet Commonly known as:  PERCOCET     TAKE these medications   aspirin EC 81 MG tablet Take 81 mg by mouth daily.   atorvastatin 20 MG tablet Commonly known as:  LIPITOR Take 20 mg by mouth at bedtime.   cyclobenzaprine 10 MG tablet Commonly known as:  FLEXERIL Take 1 tablet (10 mg total) by mouth 3 (three) times daily as needed for muscle spasms.   gabapentin 600 MG tablet Commonly known as:  NEURONTIN Take 600-1,200 mg by mouth See admin instructions. Take  600 mg in the morning and 1200 mg at night   lisinopril 20 MG tablet Commonly known as:  PRINIVIL,ZESTRIL Take 10 mg by mouth daily.   loratadine 10 MG tablet Commonly known as:  CLARITIN Take 10 mg by mouth daily as needed for allergies.   omeprazole 40 MG capsule Commonly known as:  PRILOSEC Take 40 mg by mouth daily before breakfast.   Oxycodone HCl 10 MG Tabs Take 1 tablet (10 mg total) by mouth every 4 (four) hours as needed for severe pain ((score 7 to 10)).   polyethylene glycol packet Commonly known as:  MIRALAX / GLYCOLAX Take 17 g by mouth daily as needed for mild constipation.   ranitidine 300 MG tablet Commonly known as:  ZANTAC Take 300  mg by mouth at bedtime.   SUPER B COMPLEX/C PO Take 1 tablet by mouth daily.   SYSTANE ULTRA HOME-AWAY PACK OP Apply 1 drop to eye daily.   traZODone 50 MG tablet Commonly known as:  DESYREL Take 150 mg by mouth at bedtime.        Signed: Cristi Loron 10/03/2017, 7:24 AM

## 2017-10-03 NOTE — Progress Notes (Signed)
Physical Therapy Treatment Patient Details Name: Laura Fox MRN: [id number] DOB: [birth year] Today's Date: 10/03/2017    History of Present Illness Pt is 67 yo female with h/o L4-5 discectomy one year ago who returns with pain and is now for fusion L4-5. PMH: DM, GERD, HTN, murmur, neuropathy.    PT Comments    Patient received up in chair, pleasant and willing to participate in PT session today. Aspen brace was on but was adjusted in sitting today due to patient reports of it feeling "loose". She was able to complete functional transfers and mobility with MinA today, however gait remains very slow and antalgic this morning, frequent standing rest breaks were taken during ambulation this morning. Noted mild unsteadiness with no UE support on RW requiring MIn guard to maintain balance. She was left up in the chair with all needs met this morning.     Follow Up Recommendations  Home health PT     Equipment Recommendations  None recommended by PT    Recommendations for Other Services       Precautions / Restrictions Precautions Precautions: Back Precaution Booklet Issued: Yes (comment) Precaution Comments: reviewed back precautions; pt requires assist to recall 3/3 back precautions Required Braces or Orthoses: Spinal Brace Spinal Brace: Lumbar corset;Applied in sitting position Restrictions Weight Bearing Restrictions: No    Mobility  Bed Mobility               General bed mobility comments: DNT, received up in chair   Transfers Overall transfer level: Needs assistance Equipment used: Rolling walker (2 wheeled) Transfers: Sit to/from Stand Sit to Stand: Min assist         General transfer comment: cues for safety, MInA to boost up into standing today  Ambulation/Gait Ambulation/Gait assistance: Min guard Ambulation Distance (Feet): 200 Feet Assistive device: Rolling walker (2 wheeled) Gait Pattern/deviations: Step-through pattern;Antalgic Gait velocity:  decreased   General Gait Details: slow antalgic gait pattern with frequent standing rest breaks due to pain   Stairs            Wheelchair Mobility    Modified Rankin (Stroke Patients Only)       Balance Overall balance assessment: Needs assistance Sitting-balance support: Feet supported;Bilateral upper extremity supported Sitting balance-Leahy Scale: Good     Standing balance support: No upper extremity supported;During functional activity Standing balance-Leahy Scale: Fair Standing balance comment: mildly unsteady with no UE support                             Cognition Arousal/Alertness: Awake/alert Behavior During Therapy: WFL for tasks assessed/performed Overall Cognitive Status: Within Functional Limits for tasks assessed                                 General Comments: much more alert/aware today       Exercises      General Comments        Pertinent Vitals/Pain Pain Assessment: Faces Faces Pain Scale: Hurts even more Pain Location: back  Pain Descriptors / Indicators: Sore;Operative site guarding;Spasm Pain Intervention(s): Limited activity within patient's tolerance;Monitored during session    Home Living                      Prior Function            PT Goals (current goals can now be found  in the care plan section) Acute Rehab PT Goals Patient Stated Goal: return home PT Goal Formulation: With patient Time For Goal Achievement: 10/09/17 Potential to Achieve Goals: Good Progress towards PT goals: Progressing toward goals    Frequency    Min 5X/week      PT Plan Current plan remains appropriate    Co-evaluation              AM-PAC PT "6 Clicks" Daily Activity  Outcome Measure  Difficulty turning over in bed (including adjusting bedclothes, sheets and blankets)?: A Little Difficulty moving from lying on back to sitting on the side of the bed? : A Little Difficulty sitting down on and  standing up from a chair with arms (e.g., wheelchair, bedside commode, etc,.)?: A Little Help needed moving to and from a bed to chair (including a wheelchair)?: A Little Help needed walking in hospital room?: A Little Help needed climbing 3-5 steps with a railing? : A Little 6 Click Score: 18    End of Session Equipment Utilized During Treatment: Back brace Activity Tolerance: Patient tolerated treatment well Patient left: in bed;with call bell/phone within reach   PT Visit Diagnosis: Unsteadiness on feet (R26.81);Pain;Other abnormalities of gait and mobility (R26.89) Pain - part of body: (back )     Time: 0981-1914 PT Time Calculation (min) (ACUTE ONLY): 18 min  Charges:  $Gait Training: 8-22 mins                    G Codes:       Deniece Ree PT, DPT, CBIS  Supplemental Physical Therapist [place]   Pager [phone]

## 2017-10-03 NOTE — Progress Notes (Signed)
Occupational Therapy Treatment Patient Details Name: Laura Fox MRN: [id number] DOB: [birth year] Today's Date: 10/03/2017    History of present illness Pt is 67 yo female with h/o L4-5 discectomy one year ago who returns with pain and is now for fusion L4-5. PMH: DM, GERD, HTN, murmur, neuropathy.   OT comments  Pt progressing towards OT goals; presenting sitting EOB this session, agreeable to OT tx session. Pt intermittently dozing off during session requiring cues to remain awake/alert. Educated on AE for completing ADLs with pt return demonstrating and completing LB dressing with overall MinA. Pt completing room level functional mobility using RW with MinA. Continue to recommend HHOT services after return home to maximize pt's safety with ADLs and mobility. Will continue to follow while pt remains in acute setting to progress pt towards established OT goals.    Follow Up Recommendations  Home health OT;Supervision - Intermittent    Equipment Recommendations  3 in 1 bedside commode          Precautions / Restrictions Precautions Precautions: Back Precaution Comments: reviewed back precautions; pt requires assist to recall 3/3 back precautions Required Braces or Orthoses: Spinal Brace Spinal Brace: Lumbar corset;Applied in sitting position Restrictions Weight Bearing Restrictions: No       Mobility Bed Mobility               General bed mobility comments: Pt sitting EOB upon entering room   Transfers Overall transfer level: Needs assistance Equipment used: Rolling walker (2 wheeled) Transfers: Sit to/from Stand Sit to Stand: Mod assist         General transfer comment: VC's for safety; pt requiring increased assist to boost into standing from EOB; sit<>stand from EOB x2     Balance Overall balance assessment: Mild deficits observed, not formally tested                                         ADL either performed or assessed with  clinical judgement   ADL Overall ADL's : Needs assistance/impaired             Lower Body Bathing: Moderate assistance;Sit to/from stand Lower Body Bathing Details (indicate cue type and reason): educated on use of long handled sponge during task  Upper Body Dressing : Supervision/safety;Set up;Sitting Upper Body Dressing Details (indicate cue type and reason): setup assist provided for donning lumbar corset; verbal cues to technique Lower Body Dressing: Sit to/from stand;Minimal assistance;With adaptive equipment Lower Body Dressing Details (indicate cue type and reason): pt using sock aide, reacher to don underwear, pants and socks; educated on long handled shoehorn for donning shoes; pt requires increased time for all task completion but demonstrates fairly good carryover of education provided            Tub/Shower Transfer Details (indicate cue type and reason): educated on safety during transfers and use of 3:1 as shower seat  Functional mobility during ADLs: Min guard;Rolling walker;Cueing for safety General ADL Comments: reviewed compensatory techniques and AE for completing ADLs; pt requires increased time for all task completion                        Cognition Arousal/Alertness: Lethargic Behavior During Therapy: [facility] for tasks assessed/performed Overall Cognitive Status: Within Functional Limits for tasks assessed  General Comments: pt intermittently dozing off, requires cues to remain awake and alert                          Pertinent Vitals/ Pain       Pain Assessment: Faces Faces Pain Scale: Hurts even more Pain Location: back  Pain Descriptors / Indicators: Sore;Operative site guarding;Spasm Pain Intervention(s): Limited activity within patient's tolerance;Monitored during session                                                          Frequency  Min 2X/week         Progress Toward Goals  OT Goals(current goals can now be found in the care plan section)  Progress towards OT goals: Progressing toward goals  Acute Rehab OT Goals Patient Stated Goal: return home OT Goal Formulation: With patient Time For Goal Achievement: 10/16/17 Potential to Achieve Goals: Good  Plan Discharge plan remains appropriate                     AM-PAC PT "6 Clicks" Daily Activity     Outcome Measure   Help from another person eating meals?: None Help from another person taking care of personal grooming?: None Help from another person toileting, which includes using toliet, bedpan, or urinal?: A Little Help from another person bathing (including washing, rinsing, drying)?: A Little Help from another person to put on and taking off regular upper body clothing?: A Little Help from another person to put on and taking off regular lower body clothing?: A Little 6 Click Score: 20    End of Session Equipment Utilized During Treatment: Rolling walker;Back brace  OT Visit Diagnosis: Unsteadiness on feet (R26.81);Muscle weakness (generalized) (M62.81);Pain Pain - part of body: (back )   Activity Tolerance Patient tolerated treatment well;Patient limited by lethargy   Patient Left in chair;with call bell/phone within reach   Nurse Communication Mobility status        Time: [id number] OT Time Calculation (min): 50 min  Charges: OT General Charges $OT Visit: 1 Visit OT Treatments $Self Care/Home Management : 38-52 mins  Marcy SirenBreanna Brandii Lakey, OT Pager [phone] 10/03/2017    [name] 10/03/2017, 10:27 AM

## 2017-10-18 DIAGNOSIS — D649 Anemia, unspecified: Secondary | ICD-10-CM | POA: Insufficient documentation

## 2017-10-18 DIAGNOSIS — Z9889 Other specified postprocedural states: Secondary | ICD-10-CM | POA: Insufficient documentation

## 2019-03-07 DIAGNOSIS — Z720 Tobacco use: Secondary | ICD-10-CM | POA: Insufficient documentation

## 2019-03-18 ENCOUNTER — Other Ambulatory Visit: Payer: Self-pay | Admitting: Neurosurgery

## 2019-03-18 DIAGNOSIS — M545 Low back pain, unspecified: Secondary | ICD-10-CM

## 2019-03-18 DIAGNOSIS — M4722 Other spondylosis with radiculopathy, cervical region: Secondary | ICD-10-CM

## 2019-03-19 DIAGNOSIS — E559 Vitamin D deficiency, unspecified: Secondary | ICD-10-CM | POA: Insufficient documentation

## 2019-03-20 ENCOUNTER — Telehealth: Payer: Self-pay

## 2019-03-20 NOTE — Telephone Encounter (Signed)
Spoke with patient to review her medications and drug allergies before being scheduled for a lumbar myelogram.  I informed her she will be here for two hours, needs a driver and will need to be on bedrest for 24 hours after the procedure.  She does not need to hold any medications.

## 2019-03-26 ENCOUNTER — Ambulatory Visit
Admission: RE | Admit: 2019-03-26 | Discharge: 2019-03-26 | Disposition: A | Discharge status: Home / Self Care | Payer: Medicare HMO | Source: Ambulatory Visit | Attending: Neurosurgery | Admitting: Neurosurgery

## 2019-03-26 VITALS — BP 117/52 | HR 60

## 2019-03-26 DIAGNOSIS — K219 Gastro-esophageal reflux disease without esophagitis: Secondary | ICD-10-CM | POA: Insufficient documentation

## 2019-03-26 DIAGNOSIS — M545 Low back pain, unspecified: Secondary | ICD-10-CM

## 2019-03-26 DIAGNOSIS — E042 Nontoxic multinodular goiter: Secondary | ICD-10-CM | POA: Insufficient documentation

## 2019-03-26 DIAGNOSIS — M4722 Other spondylosis with radiculopathy, cervical region: Secondary | ICD-10-CM

## 2019-03-26 DIAGNOSIS — R5383 Other fatigue: Secondary | ICD-10-CM | POA: Insufficient documentation

## 2019-03-26 DIAGNOSIS — G2581 Restless legs syndrome: Secondary | ICD-10-CM | POA: Insufficient documentation

## 2019-03-26 DIAGNOSIS — R7303 Prediabetes: Secondary | ICD-10-CM | POA: Insufficient documentation

## 2019-03-26 DIAGNOSIS — M5126 Other intervertebral disc displacement, lumbar region: Secondary | ICD-10-CM

## 2019-03-26 DIAGNOSIS — E0590 Thyrotoxicosis, unspecified without thyrotoxic crisis or storm: Secondary | ICD-10-CM | POA: Insufficient documentation

## 2019-03-26 DIAGNOSIS — E782 Mixed hyperlipidemia: Secondary | ICD-10-CM | POA: Insufficient documentation

## 2019-03-26 DIAGNOSIS — M5030 Other cervical disc degeneration, unspecified cervical region: Secondary | ICD-10-CM | POA: Insufficient documentation

## 2019-03-26 DIAGNOSIS — R5381 Other malaise: Secondary | ICD-10-CM | POA: Insufficient documentation

## 2019-03-26 DIAGNOSIS — M4316 Spondylolisthesis, lumbar region: Secondary | ICD-10-CM

## 2019-03-26 DIAGNOSIS — G4730 Sleep apnea, unspecified: Secondary | ICD-10-CM | POA: Insufficient documentation

## 2019-03-26 MED ORDER — IOPAMIDOL (ISOVUE-M 300) INJECTION 61%
10.0000 mL | Freq: Once | INTRAMUSCULAR | Status: AC | PRN
  Administered 2019-03-26: 10 mL via INTRATHECAL

## 2019-03-26 MED ORDER — DIAZEPAM 5 MG PO TABS
5.0000 mg | ORAL_TABLET | Freq: Once | ORAL | Status: AC
  Administered 2019-03-26: 13:00:00 5 mg via ORAL

## 2019-03-26 NOTE — Discharge Instructions (Signed)

## 2019-05-23 IMAGING — CR DG LUMBAR SPINE 1V
1 series · 1 of 1 positions shown · non-contrast
Comparison: Plain films 04/24/2017.

CLINICAL DATA: Localization.

EXAM:
LUMBAR SPINE - 1 VIEW

[lateral]
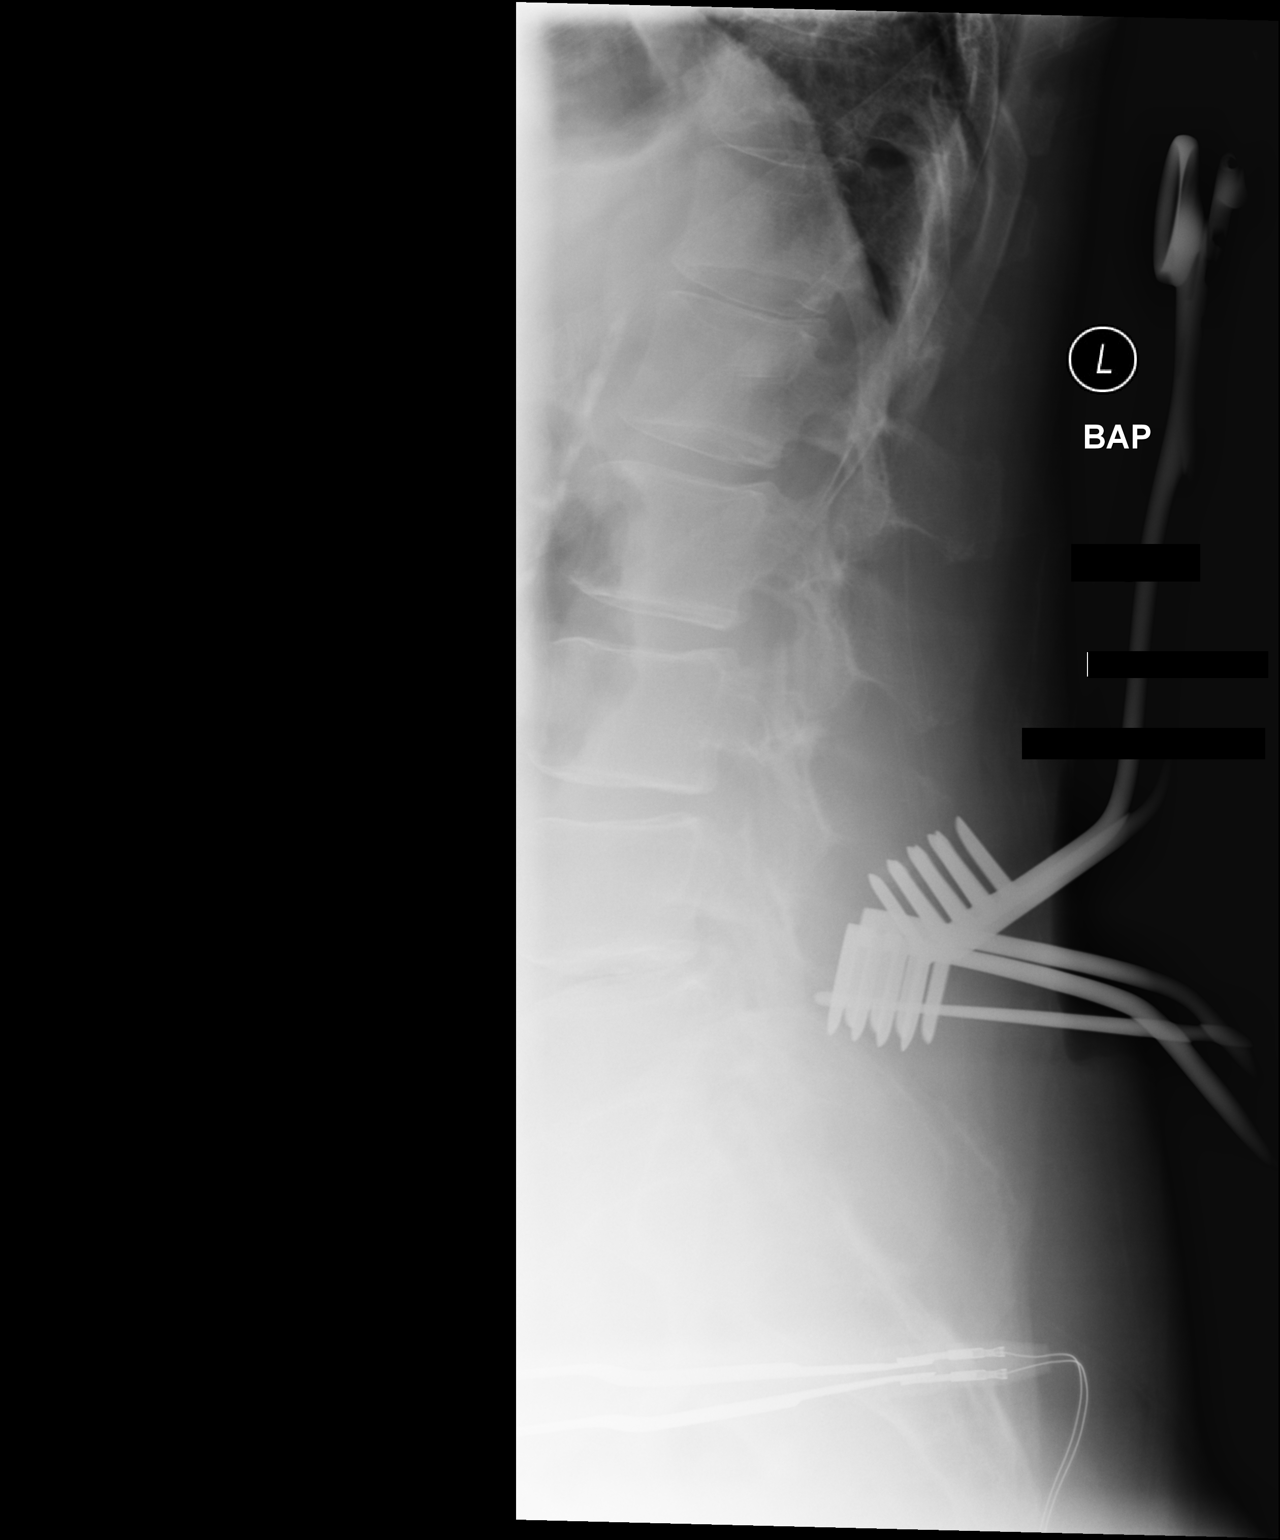

[1 of 1 positions shown; findings below may reference images not displayed]

FINDINGS: Portable cross-table lateral view demonstrates a probe directed most
closely toward L4-5.
IMPRESSION: L4-5 marked.

## 2020-11-14 IMAGING — CT CT CERVICAL SPINE W/ CM
2 series · 10 of 14 positions shown, 12 images · non-contrast
Comparison: Cervical spine MRI 07/23/2018. Lumbar spine MRI
04/16/2017.

CLINICAL DATA: Pain in the neck, low back, hips, and legs. Prior
lumbar fusion.
TECHNIQUE: Contiguous axial images were obtained through the Cervical and
Lumbar spine after the intrathecal infusion of infusion. Coronal and
sagittal reconstructions were obtained of the axial image sets.

[Series 3: cspine soft · axial · 0.32mm/px · z∈[-250,-142]mm · 5 of 82 slices shown]
[im 14/82  soft-tissue]
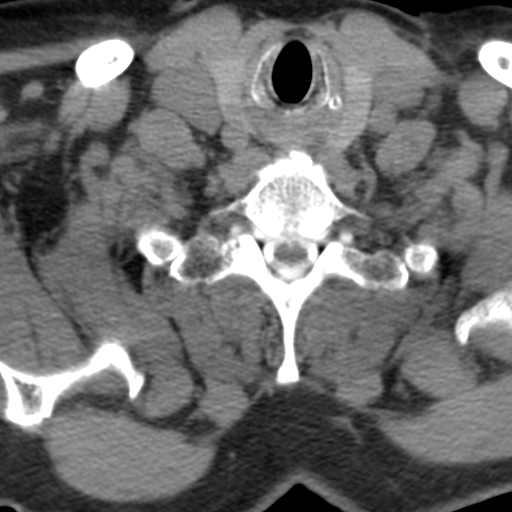
[im 28/82  soft-tissue]
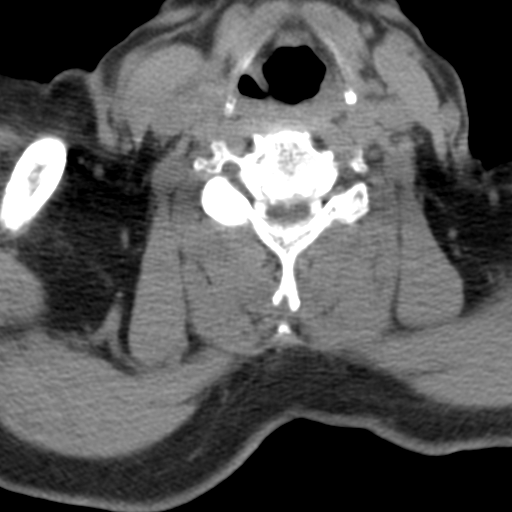
[im 41/82  soft-tissue]
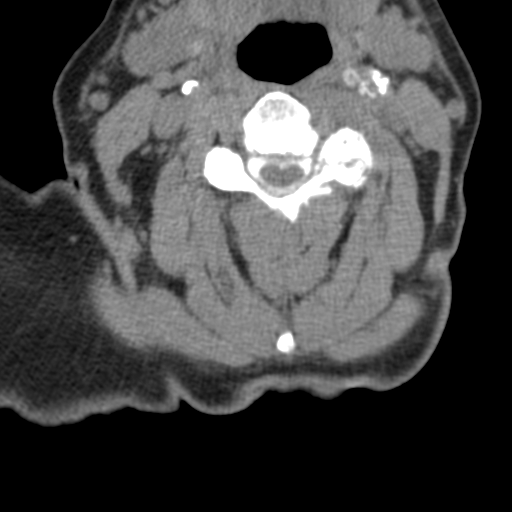
[im 55/82  soft-tissue]
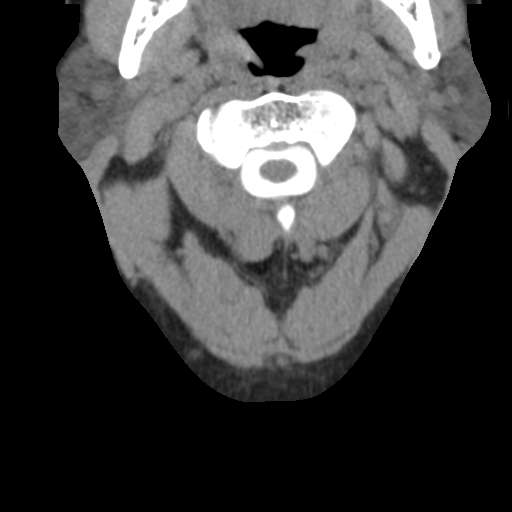
[im 68/82  soft-tissue]
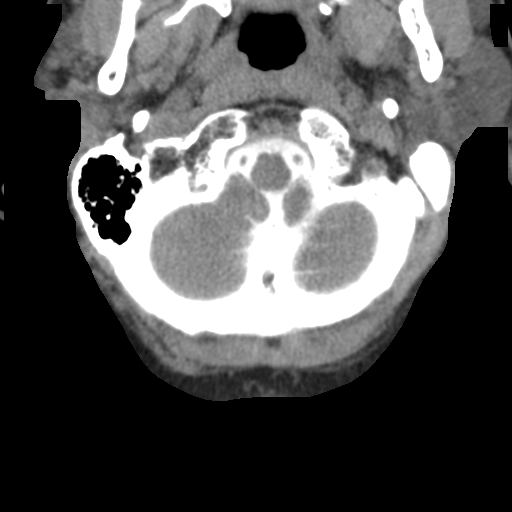

[Series 9: angled axial · axial · 0.29mm/px · z∈[-263,-156]mm · 5 of 82 slices shown, 7 images]
[im 14/82  soft-tissue]
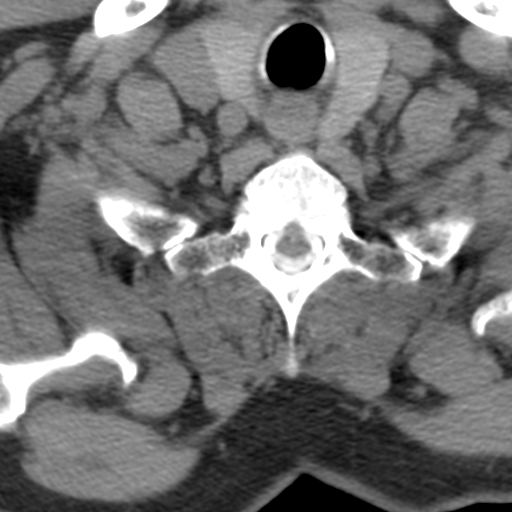
[im 14/82  bone]
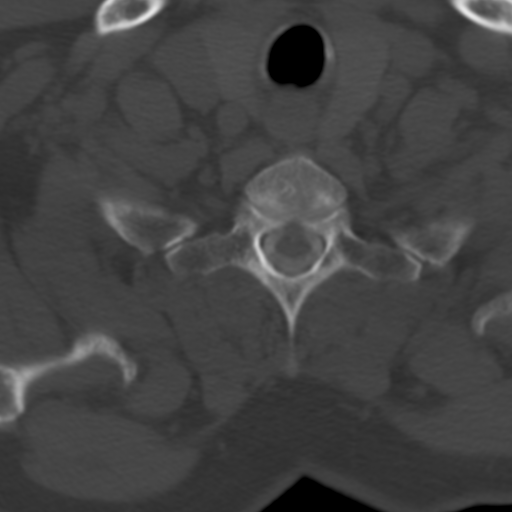
[im 28/82  bone]
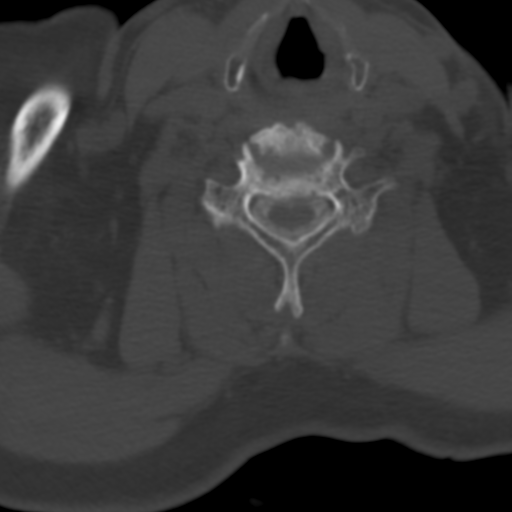
[im 41/82  bone]
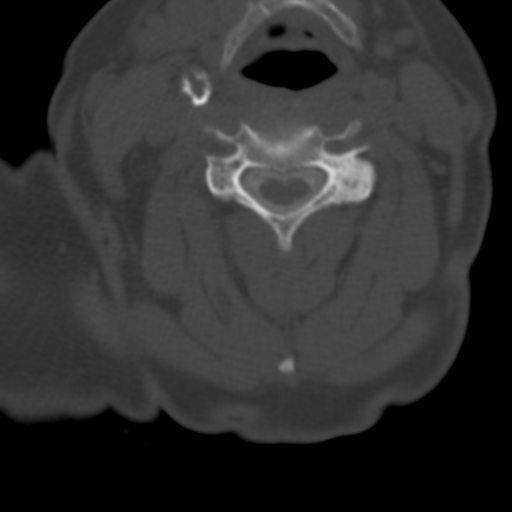
[im 55/82  bone]
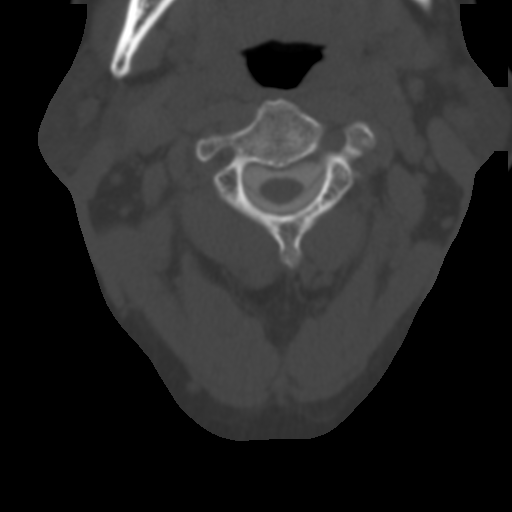
[im 68/82  soft-tissue]
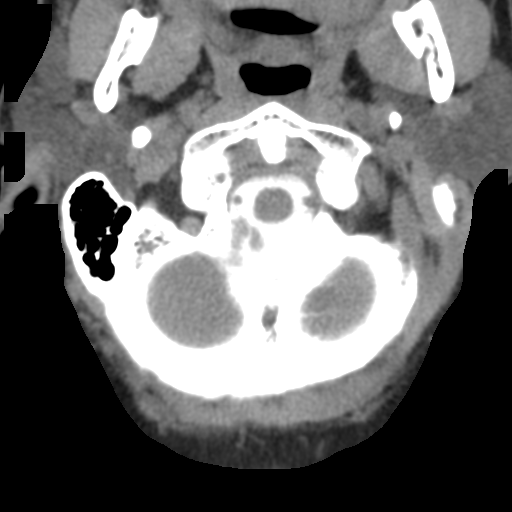
[im 68/82  bone]
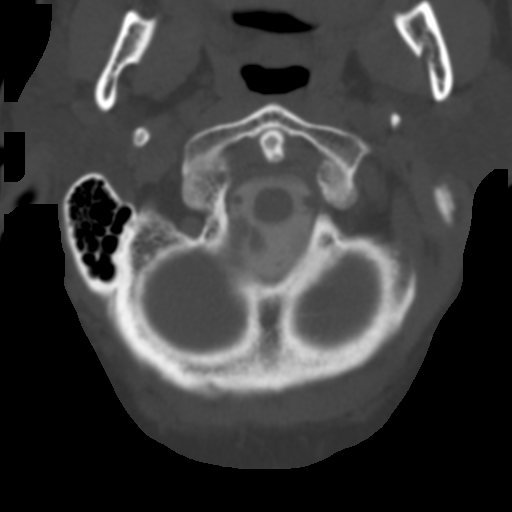

[10 of 14 positions shown; findings below may reference images not displayed]

FLUOROSCOPY TIME:  Fluoroscopy Time: 39 seconds

Radiation Exposure Index: 365.84 microGray*m^2

PROCEDURE:
LUMBAR PUNCTURE FOR CERVICAL AND LUMBAR MYELOGRAM

CERVICAL AND LUMBAR MYELOGRAM

CT CERVICAL MYELOGRAM

CT LUMBAR MYELOGRAM

After thorough discussion of risks and benefits of the procedure
including bleeding, infection, injury to nerves, blood vessels,
adjacent structures as well as headache and CSF leak, written and
oral informed consent was obtained. Consent was obtained by Dr.
Alia Tiger.

Patient was positioned prone on the fluoroscopy table. Local
anesthesia was provided with 1% lidocaine without epinephrine after
prepped and draped in the usual sterile fashion. Puncture was
performed at L2-3 using a 3 1/2 inch 22-gauge spinal needle via a
right interlaminar approach. Using a single pass through the dura,
the needle was placed within the thecal sac, with return of clear
CSF. 10 mL of Isovue X-166 was injected into the thecal sac, with
normal opacification of the nerve roots and cauda equina consistent
with free flow within the subarachnoid space. The patient was then
moved to the trendelenburg position and contrast flowed into the
Cervical spine region.

I personally performed the lumbar puncture and administered the
intrathecal contrast. I also personally supervised acquisition of
the myelogram images.
FINDINGS: CERVICAL AND LUMBAR MYELOGRAM FINDINGS:

In the cervical spine, there are multiple small ventral extradural
defects resulting in at least mild multilevel spinal stenosis,
better evaluated on accompanying CT. There is an external impression
up on and underfilling of the left C4 nerve root sleeve.

There are 5 non rib-bearing lumbar type vertebrae. There is no
significant listhesis, and no abnormal motion is evident on upright
flexion and extension radiographs. Sequelae of interval L4-5 fusion
are identified with wide patency of the thecal sac at this level.
Small ventral extradural defects are present at L2-3 and L3-4
without evidence of significant stenosis.

CT CERVICAL MYELOGRAM FINDINGS:

There is trace anterolisthesis of C3 on C4, unchanged from the prior
MRI. No fracture or suspicious osseous lesion is identified. There
is moderate disc space narrowing from C[DATE]-C7-T1 with associated
degenerative endplate spurring. Prominent calcified plaque is noted
at both carotid bifurcations.

C2-3: A central disc protrusion results in mild spinal stenosis
without neural foraminal stenosis, unchanged.

C3-4: Disc bulging, a small central disc protrusion, mild
uncovertebral spurring, and mild right and severe left facet
arthrosis result in mild spinal stenosis and severe left neural
foraminal stenosis with potential left C4 nerve root impingement,
unchanged.

C4-5: Disc bulging, a broad partially calcified central disc
protrusion, uncovertebral spurring, and mild facet arthrosis result
in mild-to-moderate spinal stenosis with mild cord flattening and
mild-to-moderate right neural foraminal stenosis, unchanged.

C5-6: Broad-based posterior disc osteophyte complex and mild facet
arthrosis result in mild-to-moderate spinal stenosis with mild cord
flattening and mild bilateral neural foraminal stenosis, unchanged.

C6-7: Disc bulging and uncovertebral spurring result in
mild-to-moderate spinal stenosis with mild cord flattening and
severe right and moderate left neural foraminal stenosis with
potential right C7 nerve root impingement, unchanged.

C7-T1: Mild disc bulging and mild facet arthrosis without stenosis,
unchanged.

CT LUMBAR MYELOGRAM FINDINGS:

There is trace anterolisthesis of L4 on L5, unchanged from the prior
MRI. No fracture or suspicious osseous lesion is identified.
Incomplete fusion of the posterior elements is incidentally noted at
T11 and T12.

There has been interval posterior and interbody fusion at L4-5. The
tips of the L5 pedicle screws extend slightly anterior to the
vertebral body. There is no evidence of screw loosening. There is
solid bridging bone across the L4-5 disc space predominantly
posterior to the interbody cage. There may be some ankylosis across
the left L4-5 facet joint as well.

The conus medullaris terminates at L1-2. There is aortic
atherosclerosis without aneurysm. Postoperative changes are noted in
the posterior lumbar soft tissues.

T12-L1: Negative.

L1-2: Minimal disc bulging without stenosis.

L2-3: Mild disc bulging is mildly increased from the prior MRI and
along with mild facet hypertrophy result in minimal to mild left
neural foraminal stenosis without spinal stenosis.

L3-4: Mild disc space narrowing with vacuum disc. Schmorl's nodes,
also present on the prior MRI. Circumferential disc bulging and mild
facet and ligamentum flavum hypertrophy result in mild left lateral
recess stenosis and mild-to-moderate bilateral neural foraminal
stenosis, mildly progressed from the prior MRI. No spinal stenosis.

L4-5: Interval wide posterior decompression and fusion. No stenosis.

L5-S1: Disc bulging and mild facet arthrosis result in
mild-to-moderate right greater than left neural foraminal stenosis
without spinal stenosis, unchanged.
IMPRESSION: 1. Relatively diffuse cervical disc and facet degeneration resulting
in mild-to-moderate multilevel spinal stenosis, unchanged from the
prior MRI.
2. Multilevel cervical neural foraminal stenosis, severe on the left
at C3-4 and on the right at C6-7.
3. Interval L4-5 decompression and fusion without residual stenosis.
4. Mildly progressive disc degeneration at L2-3 and L3-4 with
mild-to-moderate bilateral neural foraminal stenosis at L3-4.
5. Unchanged mild-to-moderate neural foraminal stenosis at L5-S1.
6.  Aortic Atherosclerosis ([id number]).

## 2023-09-28 ENCOUNTER — Ambulatory Visit (HOSPITAL_BASED_OUTPATIENT_CLINIC_OR_DEPARTMENT_OTHER)
Admit: 2023-09-28 | Discharge: 2023-09-28 | Disposition: A | Discharge status: Home / Self Care | Attending: Family Medicine | Admitting: Family Medicine

## 2023-09-28 ENCOUNTER — Ambulatory Visit (HOSPITAL_BASED_OUTPATIENT_CLINIC_OR_DEPARTMENT_OTHER): Admission: EM | Admit: 2023-09-28 | Discharge: 2023-09-28 | Disposition: A | Discharge status: Home / Self Care

## 2023-09-28 ENCOUNTER — Encounter (HOSPITAL_BASED_OUTPATIENT_CLINIC_OR_DEPARTMENT_OTHER): Payer: Self-pay | Admitting: Emergency Medicine

## 2023-09-28 DIAGNOSIS — W01198A Fall on same level from slipping, tripping and stumbling with subsequent striking against other object, initial encounter: Secondary | ICD-10-CM

## 2023-09-28 DIAGNOSIS — M79642 Pain in left hand: Secondary | ICD-10-CM

## 2023-09-28 DIAGNOSIS — M25542 Pain in joints of left hand: Secondary | ICD-10-CM

## 2023-09-28 NOTE — ED Provider Notes (Signed)
 [facility]    CSN: [id number] Arrival date & time: 09/28/23  1000      History   Chief Complaint Chief Complaint  Patient presents with   Hand Injury    HPI Nonna Renninger is a 73 y.o. female.   Sophee Mckimmy is a 73 year old female presenting for evaluation of an injury to her left hand. She reports that she tripped over a stump while working in the yard about three weeks ago, resulting in a fall. She denies hitting her head or experiencing any loss of consciousness. Following the fall, she had pain in her left knee and left hand. The knee pain resolved on its own without treatment, but the pain in her left hand has persisted and has worsened over the past week. She has a history of chronic back pain and neuropathy in both her hands and feet and takes oxycodone 10 mg three times daily for pain management. However, she reports that the oxycodone has not helped with the pain in her hand. The pain is primarily located on the lateral aspect of the fifth finger. There was some initial swelling, which has mostly subsided, though some swelling remains in the joints of the fifth finger. She is right-hand dominant. She denies any changes in her baseline neuropathy symptoms in the hand and has no pain in the wrist or arm. She reports no other injuries and has not been evaluated for this issue prior to today.  The following portions of the patient's history were reviewed and updated as appropriate: allergies, current medications, past family history, past medical history, past social history, past surgical history, and problem list.       Past Medical History:  Diagnosis Date   DDD (degenerative disc disease), lumbar    Diabetes mellitus without complication (HCC)    Type II - not on medication any longer   GERD (gastroesophageal reflux disease)    Headache    Heart murmur    Echo-  04/2016- mild aortic stenosis   Hypertension    Hyperthyroidism    goiter   Neuropathy     feet   Pneumonia 2017   Sleep apnea    cpap  does not use   (done in [place])    Patient Active Problem List   Diagnosis Date Noted   Restless leg 03/26/2019   Prediabetes 03/26/2019   Mixed hyperlipidemia 03/26/2019   Malaise and fatigue 03/26/2019   Hyperthyroidism 03/26/2019   Goiter, nontoxic, 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   Gastroesophageal reflux disease without esophagitis 03/26/2019   Apnea, sleep 03/26/2019   DDD (degenerative disc disease), cervical 03/26/2019   Vitamin D deficiency 03/19/2019   Tobacco abuse 03/07/2019   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 10/18/2017   Anemia 10/18/2017   Spondylolisthesis of lumbar region 10/01/2017   CKD (chronic kidney disease) stage 3, GFR 30-59 ml/min 08/21/2017   Other acquired hammer toe 07/12/2017   Metatarsalgia of both feet 07/12/2017   Contracture of both Achilles tendons 07/12/2017   Lumbar herniated disc 10/04/2016   Moderate aortic regurgitation 05/23/2016   Left renal mass 05/16/2016   Essential hypertension 10/19/2015   Diabetes mellitus due to underlying condition with unspecified complications (HCC) 10/19/2015   Hardening of the aorta (main artery of the heart) (HCC) 06/24/2015   Lumbosacral radiculopathy 07/17/2012   Long term current use of opiate analgesic 07/17/2012   Chronic pain syndrome 07/17/2012    Past Surgical History:  Procedure Laterality Date  COLONOSCOPY W/ POLYPECTOMY     LUMBAR LAMINECTOMY/DECOMPRESSION MICRODISCECTOMY Left 10/04/2016   Procedure: MICRODISCECTOMY LEFT LUMBAR FOUR- LUMBAR FIVE;  Surgeon: Tressie Stalker, MD;  Location: [facility] OR;  Service: Neurosurgery;  Laterality: Left;  MICRODISCECTOMY LEFT LUMBAR 4- LUMBAR 5   PARTIAL NEPHRECTOMY Left 05/26/2016   stent   UPPER GI ENDOSCOPY      OB History   No obstetric history on file.      Home Medications    Prior to Admission medications   Medication Sig Start Date End Date Taking? Authorizing  Provider  amitriptyline (ELAVIL) 25 MG tablet Take 2 tablets by mouth at bedtime. 08/14/22  Yes [provider]  azaTHIOprine (IMURAN) 50 MG tablet Take 1 tablet by mouth daily. 05/23/22  Yes [provider]  carbamazepine (TEGRETOL XR) 200 MG 12 hr tablet Take 1 tablet by mouth 2 (two) times daily. 04/26/23  Yes [provider]  clopidogrel (PLAVIX) 75 MG tablet Take by mouth. 05/25/22  Yes [provider]  aspirin EC 81 MG tablet Take 81 mg by mouth daily.    [provider]  atorvastatin (LIPITOR) 20 MG tablet Take 20 mg by mouth at bedtime.     [provider]  cyclobenzaprine (FLEXERIL) 10 MG tablet Take 1 tablet (10 mg total) by mouth 3 (three) times daily as needed for muscle spasms. 10/03/17   Tressie Stalker, MD  losartan (COZAAR) 25 MG tablet Take 25 mg by mouth 2 (two) times daily.    [provider]  omeprazole (PRILOSEC) 40 MG capsule Take 40 mg by mouth daily before breakfast.     [provider]  oxyCODONE 10 MG TABS Take 1 tablet (10 mg total) by mouth every 4 (four) hours as needed for severe pain ((score 7 to 10)). 10/03/17   Tressie Stalker, MD  Polyethyl Glycol-Propyl Glycol (SYSTANE ULTRA HOME-AWAY PACK OP) Apply 1 drop to eye daily.    [provider]  polyethylene glycol (MIRALAX / GLYCOLAX) packet Take 17 g by mouth daily as needed for mild constipation.     [provider]  SUPER B COMPLEX/C PO Take 1 tablet by mouth daily.    [provider]    Family History No family history on file.  Social History Social History   Tobacco Use   Smoking status: Former    Types: Cigarettes   Smokeless tobacco: Never   Tobacco comments:    Quick  2015  Vaping Use   Vaping status: Never Used  Substance Use Topics   Alcohol use: No   Drug use: No     Allergies   Patient has no known allergies.   Review of Systems Review of Systems  Musculoskeletal:  Positive for  arthralgias and joint swelling.  Skin:  Negative for wound.  All other systems reviewed and are negative.    Physical Exam Triage Vital Signs ED Triage Vitals  Encounter Vitals Group     BP 09/28/23 1035 136/81     Systolic BP Percentile --      Diastolic BP Percentile --      Pulse Rate 09/28/23 1035 78     Resp 09/28/23 1035 16     Temp 09/28/23 1035 98.1 F (36.7 C)     Temp Source 09/28/23 1035 Oral     SpO2 09/28/23 1035 97 %     Weight --      Height --      Head Circumference --  Peak Flow --      Pain Score 09/28/23 1034 4     Pain Loc --      Pain Education --      Exclude from Growth Chart --    No data found.  Updated Vital Signs BP 136/81 (BP Location: Right Arm)   Pulse 78   Temp 98.1 F (36.7 C) (Oral)   Resp 16   SpO2 97%   Visual Acuity Right Eye Distance:   Left Eye Distance:   Bilateral Distance:    Right Eye Near:   Left Eye Near:    Bilateral Near:     Physical Exam Vitals reviewed.  Constitutional:      General: She is not in acute distress.    Appearance: Normal appearance. She is not ill-appearing, toxic-appearing or diaphoretic.  Cardiovascular:     Rate and Rhythm: Normal rate.  Pulmonary:     Effort: Pulmonary effort is normal.  Musculoskeletal:        General: Normal range of motion.     Left hand: Swelling and tenderness present. Normal range of motion. Normal strength. Normal sensation. Normal capillary refill.     Cervical back: Normal range of motion.     Comments: pain and mild swelling localized to the left fifth finger, with extension of discomfort into the lateral aspect of the hand. Full range of motion is preserved in both the finger and hand. No focal neurological deficits are noted  Skin:    General: Skin is warm and dry.  Neurological:     General: No focal deficit present.     Mental Status: She is alert and oriented to person, place, and time.      UC Treatments / Results  Labs (all labs ordered are  listed, but only abnormal results are displayed) Labs Reviewed - No data to display  EKG   Radiology DG Hand Complete Left Result Date: 09/28/2023 CLINICAL DATA:  Pain after fall EXAM: LEFT HAND - COMPLETE 3 VIEW COMPARISON:  None Available. FINDINGS: Osteopenia. Minimally displaced fracture of the base of the proximal phalanx of the fifth digit. No additional fracture or dislocation. Slight degenerative changes along the interphalangeal joints. IMPRESSION: Subtle minimally displaced oblique fracture of the proximal metaphysis of the proximal phalanx of the fifth digit. Osteopenia.  Mild degenerative changes. Electronically Signed   By: Karen Kays M.D.   On: 09/28/2023 12:48    Procedures Procedures (including critical care time)  Medications Ordered in UC Medications - No data to display  Initial Impression / Assessment and Plan / UC Course  I have reviewed the triage vital signs and the nursing notes.  Pertinent labs & imaging results that were available during my care of the patient were reviewed by me and considered in my medical decision making (see chart for details).    73 year old female with a history of chronic back pain and neuropathy, currently on chronic oxycodone therapy, presenting with pain to the left hand primarily involving the lateral aspect of the fifth finger following a trip and fall approximately three weeks ago. On exam, there is mild swelling and tenderness localized to the left fifth finger, with discomfort extending into the lateral hand. Full range of motion is maintained in the hand and finger, and no focal neurological deficits are noted. An X-ray of the left hand was performed and is pending radiologist overread at the time of discharge. Based on my interpretation, there may be a very small fracture in the  fifth finger. The patient was placed in a finger splint and advised to perform warm Epsom salt soaks to aid in symptom relief, in addition to continuing her  usual pain management regimen. Orthopedic follow-up was recommended for further evaluation and ongoing care.  Today's evaluation has revealed no signs of a dangerous process. Discussed diagnosis with patient and/or guardian. Patient and/or guardian aware of their diagnosis, possible red flag symptoms to watch out for and need for close follow up. Patient and/or guardian understands verbal and written discharge instructions. Patient and/or guardian comfortable with plan and disposition.  Patient and/or guardian has a clear mental status at this time, good insight into illness (after discussion and teaching) and has clear judgment to make decisions regarding their care  Documentation was completed with the aid of voice recognition software. Transcription may contain typographical errors. Final Clinical Impressions(s) / UC Diagnoses   Final diagnoses:  Left hand pain  Pain involving joint of finger of left hand     Discharge Instructions      You were seen today for pain in your right hand, mainly around the joints of your pinky finger. An X-ray was done, but the final reading from the radiologist is still pending. Based on what I can see, there may be a small fracture in the finger, but we will wait for the radiologist to confirm this.  For now, a splint has been placed on your pinky finger, along with an ace wrap around your hand to help reduce pain and provide support. You may take the splint off when you need to shower or wash your hands. You should also remove it a few times a day to soak your hand in warm water with Epsom salt, which may help with pain and stiffness.  We will contact you with the X-ray results if anything changes. You can also check the results yourself through MyChart. Please follow up with orthopedics if your symptoms don't improve or if a fracture is confirmed on the X-ray.  Go to the ED immediately if:  Your pain or swelling gets worse You have trouble moving your  finger. Your finger gets numb or turns blue.     ED Prescriptions   None    PDMP not reviewed this encounter.   Lurline Idol, [place] 09/28/23 [phone]

## 2023-09-28 NOTE — Discharge Instructions (Addendum)
 You were seen today for pain in your right hand, mainly around the joints of your pinky finger. An X-ray was done, but the final reading from the radiologist is still pending. Based on what I can see, there may be a small fracture in the finger, but we will wait for the radiologist to confirm this.  For now, a splint has been placed on your pinky finger, along with an ace wrap around your hand to help reduce pain and provide support. You may take the splint off when you need to shower or wash your hands. You should also remove it a few times a day to soak your hand in warm water with Epsom salt, which may help with pain and stiffness.  We will contact you with the X-ray results if anything changes. You can also check the results yourself through MyChart. Please follow up with orthopedics if your symptoms don't improve or if a fracture is confirmed on the X-ray.  Go to the ED immediately if:  Your pain or swelling gets worse You have trouble moving your finger. Your finger gets numb or turns blue.

## 2023-09-28 NOTE — ED Triage Notes (Signed)
 Pt fell while in her yard 3 weeks ago and has left hand pain near her 4th and 5th finger.

## 2024-03-07 ENCOUNTER — Other Ambulatory Visit (HOSPITAL_BASED_OUTPATIENT_CLINIC_OR_DEPARTMENT_OTHER): Payer: Self-pay

## 2024-03-07 ENCOUNTER — Ambulatory Visit (HOSPITAL_BASED_OUTPATIENT_CLINIC_OR_DEPARTMENT_OTHER)
Admission: EM | Admit: 2024-03-07 | Discharge: 2024-03-07 | Disposition: A | Discharge status: Home / Self Care | Source: Ambulatory Visit

## 2024-03-07 ENCOUNTER — Encounter (HOSPITAL_BASED_OUTPATIENT_CLINIC_OR_DEPARTMENT_OTHER): Payer: Self-pay

## 2024-03-07 DIAGNOSIS — R220 Localized swelling, mass and lump, head: Secondary | ICD-10-CM

## 2024-03-07 DIAGNOSIS — H9202 Otalgia, left ear: Secondary | ICD-10-CM

## 2024-03-07 MED ORDER — PREDNISONE 20 MG PO TABS
40.0000 mg | ORAL_TABLET | Freq: Every day | ORAL | 0 refills | Status: AC
  Filled 2024-03-07: qty 10, 5d supply, fill #0

## 2024-03-07 MED ORDER — CETIRIZINE HCL 10 MG PO TABS
10.0000 mg | ORAL_TABLET | Freq: Every day | ORAL | 0 refills | Status: AC
  Filled 2024-03-07: qty 30, 30d supply, fill #0

## 2024-03-07 NOTE — ED Triage Notes (Signed)
 States over the last 2 weeks, has had pressure to left ear, states when she wakes in the morning, her left eye is swollen as if there is fluid in the tissue. + Headache. Patient has used ibuprofen 400mg  this morning.

## 2024-03-07 NOTE — ED Provider Notes (Signed)
 [facility]    CSN: [id number] Arrival date & time: 03/07/24  1147      History   Chief Complaint Chief Complaint  Patient presents with   sinus pressure    HPI Katheryne Gorr is a 73 y.o. female.   Patient is a 73 year old female that presents with ear pressure, left eye swelling. Reports that  over the last 2 weeks she has had pressure to left ear and when she wakes in the morning, her left eye is swollen as if there is fluid in the tissue. + Headache. Patient has used ibuprofen 400mg  this morning.  Denies any nasal congestion, rhinorrhea or sinus pressure.  No fever      Past Medical History:  Diagnosis Date   DDD (degenerative disc disease), lumbar    Diabetes mellitus without complication (HCC)    Type II - not on medication any longer   GERD (gastroesophageal reflux disease)    Headache    Heart murmur    Echo-  04/2016- mild aortic stenosis   Hypertension    Hyperthyroidism    goiter   Neuropathy    feet   Pneumonia 2017   Sleep apnea    cpap  does not use   (done in [place])    Patient Active Problem List   Diagnosis Date Noted   Restless leg 03/26/2019   Prediabetes 03/26/2019   Mixed hyperlipidemia 03/26/2019   Malaise and fatigue 03/26/2019   Hyperthyroidism 03/26/2019   Goiter, nontoxic, 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   Gastroesophageal reflux disease without esophagitis 03/26/2019   Apnea, sleep 03/26/2019   DDD (degenerative disc disease), cervical 03/26/2019   Vitamin D deficiency 03/19/2019   Tobacco abuse 03/07/2019   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 10/18/2017   Anemia 10/18/2017   Spondylolisthesis of lumbar region 10/01/2017   CKD (chronic kidney disease) stage 3, GFR 30-59 ml/min 08/21/2017   Other acquired hammer toe 07/12/2017   Metatarsalgia of both feet 07/12/2017   Contracture of both Achilles tendons 07/12/2017   Lumbar herniated disc 10/04/2016   Moderate aortic regurgitation  05/23/2016   Left renal mass 05/16/2016   Essential hypertension 10/19/2015   Diabetes mellitus due to underlying condition with unspecified complications (HCC) 10/19/2015   Hardening of the aorta (main artery of the heart) (HCC) 06/24/2015   Lumbosacral radiculopathy 07/17/2012   Long term current use of opiate analgesic 07/17/2012   Chronic pain syndrome 07/17/2012    Past Surgical History:  Procedure Laterality Date   COLONOSCOPY W/ POLYPECTOMY     LUMBAR LAMINECTOMY/DECOMPRESSION MICRODISCECTOMY Left 10/04/2016   Procedure: MICRODISCECTOMY LEFT LUMBAR FOUR- LUMBAR FIVE;  Surgeon: Reyes Budge, MD;  Location: [facility] OR;  Service: Neurosurgery;  Laterality: Left;  MICRODISCECTOMY LEFT LUMBAR 4- LUMBAR 5   PARTIAL NEPHRECTOMY Left 05/26/2016   stent   UPPER GI ENDOSCOPY      OB History   No obstetric history on file.      Home Medications    Prior to Admission medications   Medication Sig Start Date End Date Taking? Authorizing Provider  cetirizine  (ZYRTEC  ALLERGY) 10 MG tablet Take 1 tablet (10 mg total) by mouth daily. 03/07/24  Yes Princes Finger A, FNP  folic acid (FOLVITE) 1 MG tablet Take 1 mg by mouth daily. 08/25/22  Yes [provider]  Milnacipran (SAVELLA) 50 MG TABS tablet Take 50 mg by mouth 2 (two) times daily. 10/09/23  Yes [provider]  predniSONE  (DELTASONE ) 20 MG tablet Take  2 tablets (40 mg total) by mouth daily with breakfast for 5 days. 03/07/24 03/12/24 Yes Deen Deguia A, FNP  amitriptyline (ELAVIL) 25 MG tablet Take 2 tablets by mouth at bedtime. 08/14/22   [provider]  aspirin EC 81 MG tablet Take 81 mg by mouth daily.    [provider]  atorvastatin  (LIPITOR) 20 MG tablet Take 20 mg by mouth at bedtime.     [provider]  cyclobenzaprine  (FLEXERIL ) 10 MG tablet Take 1 tablet (10 mg total) by mouth 3 (three) times daily as needed for muscle spasms. 10/03/17   Mavis Purchase, MD  famotidine (PEPCID) 40 MG tablet  Take 40 mg by mouth 2 (two) times daily.    [provider]  losartan (COZAAR) 25 MG tablet Take 25 mg by mouth 2 (two) times daily.    [provider]  oxyCODONE  10 MG TABS Take 1 tablet (10 mg total) by mouth every 4 (four) hours as needed for severe pain ((score 7 to 10)). 10/03/17   Mavis Purchase, MD  Polyethyl Glycol-Propyl Glycol (SYSTANE ULTRA HOME-AWAY PACK OP) Apply 1 drop to eye daily.    [provider]  polyethylene glycol (MIRALAX  / GLYCOLAX ) packet Take 17 g by mouth daily as needed for mild constipation.     [provider]  SUPER B COMPLEX/C PO Take 1 tablet by mouth daily.    [provider]    Family History History reviewed. No pertinent family history.  Social History Social History   Tobacco Use   Smoking status: Former    Types: Cigarettes   Smokeless tobacco: Never   Tobacco comments:    Quick  2015  Vaping Use   Vaping status: Never Used  Substance Use Topics   Alcohol  use: No   Drug use: No     Allergies   Patient has no known allergies.   Review of Systems Review of Systems  See HPI Physical Exam Triage Vital Signs ED Triage Vitals  Encounter Vitals Group     BP 03/07/24 1229 115/68     Girls Systolic BP Percentile --      Girls Diastolic BP Percentile --      Boys Systolic BP Percentile --      Boys Diastolic BP Percentile --      Pulse Rate 03/07/24 1229 77     Resp 03/07/24 1229 20     Temp 03/07/24 1229 97.8 F (36.6 C)     Temp Source 03/07/24 1229 Oral     SpO2 03/07/24 1229 97 %     Weight --      Height --      Head Circumference --      Peak Flow --      Pain Score 03/07/24 1231 8     Pain Loc --      Pain Education --      Exclude from Growth Chart --    No data found.  Updated Vital Signs BP 115/68 (BP Location: Right Arm)   Pulse 77   Temp 97.8 F (36.6 C) (Oral)   Resp 20   SpO2 97%   Visual Acuity Right Eye Distance:   Left Eye Distance:   Bilateral  Distance:    Right Eye Near:   Left Eye Near:    Bilateral Near:     Physical Exam Vitals and nursing note reviewed.  Constitutional:      General: She is not in acute distress.  Appearance: Normal appearance. She is not ill-appearing, toxic-appearing or diaphoretic.  HENT:     Right Ear: Tympanic membrane, ear canal and external ear normal.     Ears:     Comments: Left middle ear effusion    Mouth/Throat:     Pharynx: Oropharynx is clear.  Eyes:     General:        Right eye: No discharge.        Left eye: No discharge.     Conjunctiva/sclera: Conjunctivae normal.     Comments: Mild swelling just under left eye. Mildly tender.   Cardiovascular:     Rate and Rhythm: Normal rate and regular rhythm.  Pulmonary:     Effort: Pulmonary effort is normal.     Breath sounds: Normal breath sounds.  Neurological:     Mental Status: She is alert.  Psychiatric:        Mood and Affect: Mood normal.      UC Treatments / Results  Labs (all labs ordered are listed, but only abnormal results are displayed) Labs Reviewed - No data to display  EKG   Radiology No results found.  Procedures Procedures (including critical care time)  Medications Ordered in UC Medications - No data to display  Initial Impression / Assessment and Plan / UC Course  I have reviewed the triage vital signs and the nursing notes.  Pertinent labs & imaging results that were available during my care of the patient were reviewed by me and considered in my medical decision making (see chart for details).     Facial swelling with left middle ear effusion-believe this could be allergy related.  No concern for infection at this time. Starting on prednisone  daily for the next 5 days.  Recommend Zyrtec  daily.  Follow-up with PCP for any continued issues Final Clinical Impressions(s) / UC Diagnoses   Final diagnoses:  Facial swelling  Left ear pain     Discharge Instructions      Started you on  Zyrtec  daily which is an allergy medication.  This should help dry up the fluid in your ear.  Also starting you on prednisone  daily take this daily with food. Follow-up with your doctor for any continued issues     ED Prescriptions     Medication Sig Dispense Auth. Provider   predniSONE  (DELTASONE ) 20 MG tablet Take 2 tablets (40 mg total) by mouth daily with breakfast for 5 days. 10 tablet Tattiana Fakhouri A, FNP   cetirizine  (ZYRTEC  ALLERGY) 10 MG tablet Take 1 tablet (10 mg total) by mouth daily. 30 tablet Adah Wilbert LABOR, FNP      PDMP not reviewed this encounter.   Adah Wilbert LABOR, FNP 03/07/24 1533

## 2024-03-07 NOTE — Discharge Instructions (Signed)
 Started you on Zyrtec  daily which is an allergy medication.  This should help dry up the fluid in your ear.  Also starting you on prednisone  daily take this daily with food. Follow-up with your doctor for any continued issues
